# Patient Record
Sex: Male | Born: 1940
Health system: Southern US, Community
[De-identification: ages and names within clinical notes are randomized; demographics above are authoritative.]

## PROBLEM LIST (undated history)

## (undated) DIAGNOSIS — M109 Gout, unspecified: Secondary | ICD-10-CM

## (undated) DIAGNOSIS — I350 Nonrheumatic aortic (valve) stenosis: Secondary | ICD-10-CM

## (undated) DIAGNOSIS — R7303 Prediabetes: Secondary | ICD-10-CM

## (undated) DIAGNOSIS — M1710 Unilateral primary osteoarthritis, unspecified knee: Secondary | ICD-10-CM

## (undated) DIAGNOSIS — M1711 Unilateral primary osteoarthritis, right knee: Secondary | ICD-10-CM

## (undated) DIAGNOSIS — S82002A Unspecified fracture of left patella, initial encounter for closed fracture: Secondary | ICD-10-CM

## (undated) DIAGNOSIS — Z974 Presence of external hearing-aid: Secondary | ICD-10-CM

## (undated) DIAGNOSIS — Z87828 Personal history of other (healed) physical injury and trauma: Secondary | ICD-10-CM

## (undated) DIAGNOSIS — R9431 Abnormal electrocardiogram [ECG] [EKG]: Secondary | ICD-10-CM

## (undated) DIAGNOSIS — I10 Essential (primary) hypertension: Secondary | ICD-10-CM

## (undated) DIAGNOSIS — C8590 Non-Hodgkin lymphoma, unspecified, unspecified site: Secondary | ICD-10-CM

## (undated) DIAGNOSIS — Z973 Presence of spectacles and contact lenses: Secondary | ICD-10-CM

## (undated) DIAGNOSIS — I358 Other nonrheumatic aortic valve disorders: Secondary | ICD-10-CM

## (undated) DIAGNOSIS — M8580 Other specified disorders of bone density and structure, unspecified site: Secondary | ICD-10-CM

## (undated) HISTORY — PX: EYE SURGERY: SHX253

## (undated) HISTORY — PX: JOINT REPLACEMENT: SHX530

## (undated) HISTORY — DX: Unspecified fracture of left patella, initial encounter for closed fracture: S82.002A

## (undated) HISTORY — DX: Other specified disorders of bone density and structure, unspecified site: M85.80

## (undated) HISTORY — DX: Personal history of other (healed) physical injury and trauma: Z87.828

---
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
## 1979-07-30 HISTORY — PX: VASECTOMY: SHX75

## 2011-08-22 DIAGNOSIS — H903 Sensorineural hearing loss, bilateral: Secondary | ICD-10-CM | POA: Diagnosis not present

## 2011-08-22 DIAGNOSIS — B49 Unspecified mycosis: Secondary | ICD-10-CM | POA: Diagnosis not present

## 2011-08-26 DIAGNOSIS — Z23 Encounter for immunization: Secondary | ICD-10-CM | POA: Diagnosis not present

## 2011-08-26 DIAGNOSIS — R059 Cough, unspecified: Secondary | ICD-10-CM | POA: Diagnosis not present

## 2011-08-26 DIAGNOSIS — R05 Cough: Secondary | ICD-10-CM | POA: Diagnosis not present

## 2011-10-07 DIAGNOSIS — B49 Unspecified mycosis: Secondary | ICD-10-CM | POA: Diagnosis not present

## 2011-10-07 DIAGNOSIS — H903 Sensorineural hearing loss, bilateral: Secondary | ICD-10-CM | POA: Diagnosis not present

## 2011-10-09 DIAGNOSIS — E7800 Pure hypercholesterolemia, unspecified: Secondary | ICD-10-CM | POA: Diagnosis not present

## 2011-10-15 DIAGNOSIS — E7800 Pure hypercholesterolemia, unspecified: Secondary | ICD-10-CM | POA: Diagnosis not present

## 2011-12-27 DIAGNOSIS — H903 Sensorineural hearing loss, bilateral: Secondary | ICD-10-CM | POA: Diagnosis not present

## 2011-12-27 DIAGNOSIS — H6120 Impacted cerumen, unspecified ear: Secondary | ICD-10-CM | POA: Diagnosis not present

## 2012-01-13 DIAGNOSIS — M47817 Spondylosis without myelopathy or radiculopathy, lumbosacral region: Secondary | ICD-10-CM | POA: Diagnosis not present

## 2012-01-15 DIAGNOSIS — E559 Vitamin D deficiency, unspecified: Secondary | ICD-10-CM | POA: Diagnosis not present

## 2012-01-15 DIAGNOSIS — C8299 Follicular lymphoma, unspecified, extranodal and solid organ sites: Secondary | ICD-10-CM | POA: Diagnosis not present

## 2012-03-19 DIAGNOSIS — H2510 Age-related nuclear cataract, unspecified eye: Secondary | ICD-10-CM | POA: Diagnosis not present

## 2012-03-19 DIAGNOSIS — H43399 Other vitreous opacities, unspecified eye: Secondary | ICD-10-CM | POA: Diagnosis not present

## 2012-04-14 DIAGNOSIS — E7800 Pure hypercholesterolemia, unspecified: Secondary | ICD-10-CM | POA: Diagnosis not present

## 2012-04-14 DIAGNOSIS — I10 Essential (primary) hypertension: Secondary | ICD-10-CM | POA: Diagnosis not present

## 2012-04-15 DIAGNOSIS — I10 Essential (primary) hypertension: Secondary | ICD-10-CM | POA: Diagnosis not present

## 2012-04-15 DIAGNOSIS — E7800 Pure hypercholesterolemia, unspecified: Secondary | ICD-10-CM | POA: Diagnosis not present

## 2012-05-28 DIAGNOSIS — Z23 Encounter for immunization: Secondary | ICD-10-CM | POA: Diagnosis not present

## 2012-06-23 DIAGNOSIS — N419 Inflammatory disease of prostate, unspecified: Secondary | ICD-10-CM | POA: Diagnosis not present

## 2012-06-23 DIAGNOSIS — R300 Dysuria: Secondary | ICD-10-CM | POA: Diagnosis not present

## 2012-06-24 DIAGNOSIS — R300 Dysuria: Secondary | ICD-10-CM | POA: Diagnosis not present

## 2012-07-03 DIAGNOSIS — E785 Hyperlipidemia, unspecified: Secondary | ICD-10-CM | POA: Diagnosis not present

## 2012-07-03 DIAGNOSIS — Z79899 Other long term (current) drug therapy: Secondary | ICD-10-CM | POA: Diagnosis not present

## 2012-07-03 DIAGNOSIS — C8298 Follicular lymphoma, unspecified, lymph nodes of multiple sites: Secondary | ICD-10-CM | POA: Diagnosis not present

## 2012-07-03 DIAGNOSIS — I10 Essential (primary) hypertension: Secondary | ICD-10-CM | POA: Diagnosis not present

## 2012-07-03 DIAGNOSIS — R21 Rash and other nonspecific skin eruption: Secondary | ICD-10-CM | POA: Diagnosis not present

## 2012-07-03 DIAGNOSIS — H9190 Unspecified hearing loss, unspecified ear: Secondary | ICD-10-CM | POA: Diagnosis not present

## 2012-07-03 DIAGNOSIS — N400 Enlarged prostate without lower urinary tract symptoms: Secondary | ICD-10-CM | POA: Diagnosis not present

## 2012-07-03 DIAGNOSIS — E559 Vitamin D deficiency, unspecified: Secondary | ICD-10-CM | POA: Diagnosis not present

## 2012-07-03 DIAGNOSIS — C8299 Follicular lymphoma, unspecified, extranodal and solid organ sites: Secondary | ICD-10-CM | POA: Diagnosis not present

## 2012-07-03 DIAGNOSIS — C8599 Non-Hodgkin lymphoma, unspecified, extranodal and solid organ sites: Secondary | ICD-10-CM | POA: Diagnosis not present

## 2012-10-29 DIAGNOSIS — H2510 Age-related nuclear cataract, unspecified eye: Secondary | ICD-10-CM | POA: Diagnosis not present

## 2012-10-29 DIAGNOSIS — H538 Other visual disturbances: Secondary | ICD-10-CM | POA: Diagnosis not present

## 2013-01-01 DIAGNOSIS — E785 Hyperlipidemia, unspecified: Secondary | ICD-10-CM | POA: Diagnosis not present

## 2013-01-01 DIAGNOSIS — I10 Essential (primary) hypertension: Secondary | ICD-10-CM | POA: Diagnosis not present

## 2013-01-01 DIAGNOSIS — E559 Vitamin D deficiency, unspecified: Secondary | ICD-10-CM | POA: Diagnosis not present

## 2013-01-01 DIAGNOSIS — N400 Enlarged prostate without lower urinary tract symptoms: Secondary | ICD-10-CM | POA: Diagnosis not present

## 2013-01-01 DIAGNOSIS — N320 Bladder-neck obstruction: Secondary | ICD-10-CM | POA: Diagnosis not present

## 2013-01-01 DIAGNOSIS — C8299 Follicular lymphoma, unspecified, extranodal and solid organ sites: Secondary | ICD-10-CM | POA: Diagnosis not present

## 2013-05-27 DIAGNOSIS — L821 Other seborrheic keratosis: Secondary | ICD-10-CM | POA: Diagnosis not present

## 2013-05-27 DIAGNOSIS — L578 Other skin changes due to chronic exposure to nonionizing radiation: Secondary | ICD-10-CM | POA: Diagnosis not present

## 2013-05-27 DIAGNOSIS — L723 Sebaceous cyst: Secondary | ICD-10-CM | POA: Diagnosis not present

## 2013-07-07 DIAGNOSIS — L578 Other skin changes due to chronic exposure to nonionizing radiation: Secondary | ICD-10-CM | POA: Diagnosis not present

## 2013-07-07 DIAGNOSIS — L738 Other specified follicular disorders: Secondary | ICD-10-CM | POA: Diagnosis not present

## 2013-07-07 DIAGNOSIS — L299 Pruritus, unspecified: Secondary | ICD-10-CM | POA: Diagnosis not present

## 2013-07-12 DIAGNOSIS — N320 Bladder-neck obstruction: Secondary | ICD-10-CM | POA: Diagnosis not present

## 2013-07-12 DIAGNOSIS — I10 Essential (primary) hypertension: Secondary | ICD-10-CM | POA: Diagnosis not present

## 2013-07-12 DIAGNOSIS — N138 Other obstructive and reflux uropathy: Secondary | ICD-10-CM | POA: Diagnosis not present

## 2013-07-12 DIAGNOSIS — N401 Enlarged prostate with lower urinary tract symptoms: Secondary | ICD-10-CM | POA: Diagnosis not present

## 2013-07-12 DIAGNOSIS — E559 Vitamin D deficiency, unspecified: Secondary | ICD-10-CM | POA: Diagnosis not present

## 2013-07-12 DIAGNOSIS — E785 Hyperlipidemia, unspecified: Secondary | ICD-10-CM | POA: Diagnosis not present

## 2013-07-12 DIAGNOSIS — C8299 Follicular lymphoma, unspecified, extranodal and solid organ sites: Secondary | ICD-10-CM | POA: Diagnosis not present

## 2013-08-06 DIAGNOSIS — L299 Pruritus, unspecified: Secondary | ICD-10-CM | POA: Diagnosis not present

## 2013-08-06 DIAGNOSIS — L578 Other skin changes due to chronic exposure to nonionizing radiation: Secondary | ICD-10-CM | POA: Diagnosis not present

## 2013-08-06 DIAGNOSIS — L738 Other specified follicular disorders: Secondary | ICD-10-CM | POA: Diagnosis not present

## 2013-08-06 DIAGNOSIS — L678 Other hair color and hair shaft abnormalities: Secondary | ICD-10-CM | POA: Diagnosis not present

## 2014-01-05 DIAGNOSIS — M25569 Pain in unspecified knee: Secondary | ICD-10-CM | POA: Diagnosis not present

## 2014-01-05 DIAGNOSIS — M1710 Unilateral primary osteoarthritis, unspecified knee: Secondary | ICD-10-CM | POA: Diagnosis not present

## 2014-02-16 DIAGNOSIS — C8299 Follicular lymphoma, unspecified, extranodal and solid organ sites: Secondary | ICD-10-CM | POA: Diagnosis not present

## 2014-02-16 DIAGNOSIS — R634 Abnormal weight loss: Secondary | ICD-10-CM | POA: Diagnosis not present

## 2014-02-16 DIAGNOSIS — N320 Bladder-neck obstruction: Secondary | ICD-10-CM | POA: Diagnosis not present

## 2014-02-16 DIAGNOSIS — N400 Enlarged prostate without lower urinary tract symptoms: Secondary | ICD-10-CM | POA: Diagnosis not present

## 2014-02-16 DIAGNOSIS — Z09 Encounter for follow-up examination after completed treatment for conditions other than malignant neoplasm: Secondary | ICD-10-CM | POA: Diagnosis not present

## 2014-02-16 DIAGNOSIS — R5383 Other fatigue: Secondary | ICD-10-CM | POA: Diagnosis not present

## 2014-02-16 DIAGNOSIS — I10 Essential (primary) hypertension: Secondary | ICD-10-CM | POA: Diagnosis not present

## 2014-02-16 DIAGNOSIS — Z87898 Personal history of other specified conditions: Secondary | ICD-10-CM | POA: Diagnosis not present

## 2014-02-16 DIAGNOSIS — R61 Generalized hyperhidrosis: Secondary | ICD-10-CM | POA: Diagnosis not present

## 2014-02-16 DIAGNOSIS — R5381 Other malaise: Secondary | ICD-10-CM | POA: Diagnosis not present

## 2014-02-16 DIAGNOSIS — E785 Hyperlipidemia, unspecified: Secondary | ICD-10-CM | POA: Diagnosis not present

## 2014-02-25 DIAGNOSIS — I998 Other disorder of circulatory system: Secondary | ICD-10-CM | POA: Diagnosis not present

## 2014-02-25 DIAGNOSIS — R61 Generalized hyperhidrosis: Secondary | ICD-10-CM | POA: Diagnosis not present

## 2014-02-25 DIAGNOSIS — K449 Diaphragmatic hernia without obstruction or gangrene: Secondary | ICD-10-CM | POA: Diagnosis not present

## 2014-02-25 DIAGNOSIS — R5383 Other fatigue: Secondary | ICD-10-CM | POA: Diagnosis not present

## 2014-02-25 DIAGNOSIS — I700 Atherosclerosis of aorta: Secondary | ICD-10-CM | POA: Diagnosis not present

## 2014-02-25 DIAGNOSIS — R5381 Other malaise: Secondary | ICD-10-CM | POA: Diagnosis not present

## 2014-02-25 DIAGNOSIS — N400 Enlarged prostate without lower urinary tract symptoms: Secondary | ICD-10-CM | POA: Diagnosis not present

## 2014-02-25 DIAGNOSIS — C8299 Follicular lymphoma, unspecified, extranodal and solid organ sites: Secondary | ICD-10-CM | POA: Diagnosis not present

## 2014-02-25 DIAGNOSIS — I10 Essential (primary) hypertension: Secondary | ICD-10-CM | POA: Diagnosis not present

## 2014-02-25 DIAGNOSIS — D739 Disease of spleen, unspecified: Secondary | ICD-10-CM | POA: Diagnosis not present

## 2014-02-25 DIAGNOSIS — E559 Vitamin D deficiency, unspecified: Secondary | ICD-10-CM | POA: Diagnosis not present

## 2014-02-25 DIAGNOSIS — K5730 Diverticulosis of large intestine without perforation or abscess without bleeding: Secondary | ICD-10-CM | POA: Diagnosis not present

## 2014-02-25 DIAGNOSIS — C8589 Other specified types of non-Hodgkin lymphoma, extranodal and solid organ sites: Secondary | ICD-10-CM | POA: Diagnosis not present

## 2014-02-25 DIAGNOSIS — K8020 Calculus of gallbladder without cholecystitis without obstruction: Secondary | ICD-10-CM | POA: Diagnosis not present

## 2014-02-25 DIAGNOSIS — R634 Abnormal weight loss: Secondary | ICD-10-CM | POA: Diagnosis not present

## 2014-02-25 DIAGNOSIS — E785 Hyperlipidemia, unspecified: Secondary | ICD-10-CM | POA: Diagnosis not present

## 2014-02-25 DIAGNOSIS — M5380 Other specified dorsopathies, site unspecified: Secondary | ICD-10-CM | POA: Diagnosis not present

## 2014-02-25 DIAGNOSIS — R918 Other nonspecific abnormal finding of lung field: Secondary | ICD-10-CM | POA: Diagnosis not present

## 2014-02-25 DIAGNOSIS — R911 Solitary pulmonary nodule: Secondary | ICD-10-CM | POA: Diagnosis not present

## 2014-05-06 DIAGNOSIS — Z23 Encounter for immunization: Secondary | ICD-10-CM | POA: Diagnosis not present

## 2014-07-11 DIAGNOSIS — M1711 Unilateral primary osteoarthritis, right knee: Secondary | ICD-10-CM | POA: Diagnosis not present

## 2014-07-14 DIAGNOSIS — M25561 Pain in right knee: Secondary | ICD-10-CM | POA: Diagnosis not present

## 2014-07-19 DIAGNOSIS — M1711 Unilateral primary osteoarthritis, right knee: Secondary | ICD-10-CM | POA: Diagnosis not present

## 2014-08-01 DIAGNOSIS — B0229 Other postherpetic nervous system involvement: Secondary | ICD-10-CM | POA: Diagnosis not present

## 2014-08-01 DIAGNOSIS — R739 Hyperglycemia, unspecified: Secondary | ICD-10-CM | POA: Diagnosis not present

## 2014-08-01 DIAGNOSIS — Z0000 Encounter for general adult medical examination without abnormal findings: Secondary | ICD-10-CM | POA: Diagnosis not present

## 2014-08-01 DIAGNOSIS — C8590 Non-Hodgkin lymphoma, unspecified, unspecified site: Secondary | ICD-10-CM | POA: Diagnosis not present

## 2014-08-01 DIAGNOSIS — I10 Essential (primary) hypertension: Secondary | ICD-10-CM | POA: Diagnosis not present

## 2014-08-01 DIAGNOSIS — E559 Vitamin D deficiency, unspecified: Secondary | ICD-10-CM | POA: Diagnosis not present

## 2014-08-01 DIAGNOSIS — E785 Hyperlipidemia, unspecified: Secondary | ICD-10-CM | POA: Diagnosis not present

## 2014-08-05 DIAGNOSIS — M1A9XX0 Chronic gout, unspecified, without tophus (tophi): Secondary | ICD-10-CM | POA: Diagnosis not present

## 2014-08-05 DIAGNOSIS — Y929 Unspecified place or not applicable: Secondary | ICD-10-CM | POA: Diagnosis not present

## 2014-08-05 DIAGNOSIS — S83281A Other tear of lateral meniscus, current injury, right knee, initial encounter: Secondary | ICD-10-CM | POA: Diagnosis not present

## 2014-08-05 DIAGNOSIS — X58XXXA Exposure to other specified factors, initial encounter: Secondary | ICD-10-CM | POA: Diagnosis not present

## 2014-08-05 DIAGNOSIS — M94261 Chondromalacia, right knee: Secondary | ICD-10-CM | POA: Diagnosis not present

## 2014-08-05 DIAGNOSIS — S83241A Other tear of medial meniscus, current injury, right knee, initial encounter: Secondary | ICD-10-CM | POA: Diagnosis not present

## 2014-08-05 DIAGNOSIS — M659 Synovitis and tenosynovitis, unspecified: Secondary | ICD-10-CM | POA: Diagnosis not present

## 2014-08-05 HISTORY — PX: KNEE ARTHROSCOPY WITH MEDIAL MENISECTOMY: SHX5651

## 2014-09-02 DIAGNOSIS — C8200 Follicular lymphoma grade I, unspecified site: Secondary | ICD-10-CM | POA: Diagnosis not present

## 2014-09-02 DIAGNOSIS — R5383 Other fatigue: Secondary | ICD-10-CM | POA: Diagnosis not present

## 2014-09-02 DIAGNOSIS — E559 Vitamin D deficiency, unspecified: Secondary | ICD-10-CM | POA: Diagnosis not present

## 2014-09-02 DIAGNOSIS — R634 Abnormal weight loss: Secondary | ICD-10-CM | POA: Diagnosis not present

## 2014-09-02 DIAGNOSIS — R6889 Other general symptoms and signs: Secondary | ICD-10-CM | POA: Diagnosis not present

## 2014-09-02 DIAGNOSIS — Z6828 Body mass index (BMI) 28.0-28.9, adult: Secondary | ICD-10-CM | POA: Diagnosis not present

## 2014-09-02 DIAGNOSIS — R61 Generalized hyperhidrosis: Secondary | ICD-10-CM | POA: Diagnosis not present

## 2014-09-02 DIAGNOSIS — E785 Hyperlipidemia, unspecified: Secondary | ICD-10-CM | POA: Diagnosis not present

## 2014-09-02 DIAGNOSIS — N401 Enlarged prostate with lower urinary tract symptoms: Secondary | ICD-10-CM | POA: Diagnosis not present

## 2014-09-02 DIAGNOSIS — N320 Bladder-neck obstruction: Secondary | ICD-10-CM | POA: Diagnosis not present

## 2014-09-02 DIAGNOSIS — I10 Essential (primary) hypertension: Secondary | ICD-10-CM | POA: Diagnosis not present

## 2014-09-19 DIAGNOSIS — M109 Gout, unspecified: Secondary | ICD-10-CM | POA: Diagnosis not present

## 2014-10-20 DIAGNOSIS — E119 Type 2 diabetes mellitus without complications: Secondary | ICD-10-CM | POA: Diagnosis not present

## 2014-10-20 DIAGNOSIS — M109 Gout, unspecified: Secondary | ICD-10-CM | POA: Diagnosis not present

## 2014-12-19 DIAGNOSIS — M109 Gout, unspecified: Secondary | ICD-10-CM | POA: Diagnosis not present

## 2015-02-06 DIAGNOSIS — M25561 Pain in right knee: Secondary | ICD-10-CM | POA: Diagnosis not present

## 2015-02-06 DIAGNOSIS — T560X1A Toxic effect of lead and its compounds, accidental (unintentional), initial encounter: Secondary | ICD-10-CM | POA: Diagnosis not present

## 2015-02-10 DIAGNOSIS — M25561 Pain in right knee: Secondary | ICD-10-CM | POA: Diagnosis not present

## 2015-02-21 DIAGNOSIS — E119 Type 2 diabetes mellitus without complications: Secondary | ICD-10-CM | POA: Diagnosis not present

## 2015-02-21 DIAGNOSIS — N400 Enlarged prostate without lower urinary tract symptoms: Secondary | ICD-10-CM | POA: Diagnosis not present

## 2015-03-03 DIAGNOSIS — M109 Gout, unspecified: Secondary | ICD-10-CM | POA: Diagnosis not present

## 2015-03-03 DIAGNOSIS — C8200 Follicular lymphoma grade I, unspecified site: Secondary | ICD-10-CM | POA: Diagnosis not present

## 2015-03-03 DIAGNOSIS — Z6826 Body mass index (BMI) 26.0-26.9, adult: Secondary | ICD-10-CM | POA: Diagnosis not present

## 2015-03-03 DIAGNOSIS — N182 Chronic kidney disease, stage 2 (mild): Secondary | ICD-10-CM | POA: Diagnosis not present

## 2015-03-06 DIAGNOSIS — M25561 Pain in right knee: Secondary | ICD-10-CM | POA: Diagnosis not present

## 2015-03-06 DIAGNOSIS — T560X1A Toxic effect of lead and its compounds, accidental (unintentional), initial encounter: Secondary | ICD-10-CM | POA: Diagnosis not present

## 2015-03-14 DIAGNOSIS — M25561 Pain in right knee: Secondary | ICD-10-CM | POA: Diagnosis not present

## 2015-03-17 DIAGNOSIS — M25561 Pain in right knee: Secondary | ICD-10-CM | POA: Diagnosis not present

## 2015-03-21 DIAGNOSIS — M25561 Pain in right knee: Secondary | ICD-10-CM | POA: Diagnosis not present

## 2015-03-23 DIAGNOSIS — M25561 Pain in right knee: Secondary | ICD-10-CM | POA: Diagnosis not present

## 2015-03-27 DIAGNOSIS — T560X1D Toxic effect of lead and its compounds, accidental (unintentional), subsequent encounter: Secondary | ICD-10-CM | POA: Diagnosis not present

## 2015-03-27 DIAGNOSIS — M1711 Unilateral primary osteoarthritis, right knee: Secondary | ICD-10-CM | POA: Diagnosis not present

## 2015-03-28 DIAGNOSIS — M25561 Pain in right knee: Secondary | ICD-10-CM | POA: Diagnosis not present

## 2015-03-30 DIAGNOSIS — M25561 Pain in right knee: Secondary | ICD-10-CM | POA: Diagnosis not present

## 2015-04-04 DIAGNOSIS — M1711 Unilateral primary osteoarthritis, right knee: Secondary | ICD-10-CM | POA: Diagnosis not present

## 2015-04-10 DIAGNOSIS — M1711 Unilateral primary osteoarthritis, right knee: Secondary | ICD-10-CM | POA: Diagnosis not present

## 2015-04-10 DIAGNOSIS — T560X1A Toxic effect of lead and its compounds, accidental (unintentional), initial encounter: Secondary | ICD-10-CM | POA: Diagnosis not present

## 2015-05-08 DIAGNOSIS — T560X1D Toxic effect of lead and its compounds, accidental (unintentional), subsequent encounter: Secondary | ICD-10-CM | POA: Diagnosis not present

## 2015-05-08 DIAGNOSIS — M1711 Unilateral primary osteoarthritis, right knee: Secondary | ICD-10-CM | POA: Diagnosis not present

## 2015-05-19 DIAGNOSIS — L259 Unspecified contact dermatitis, unspecified cause: Secondary | ICD-10-CM | POA: Diagnosis not present

## 2015-05-19 DIAGNOSIS — L400 Psoriasis vulgaris: Secondary | ICD-10-CM | POA: Diagnosis not present

## 2015-05-25 DIAGNOSIS — M1A00X0 Idiopathic chronic gout, unspecified site, without tophus (tophi): Secondary | ICD-10-CM | POA: Diagnosis not present

## 2015-05-25 DIAGNOSIS — L309 Dermatitis, unspecified: Secondary | ICD-10-CM | POA: Diagnosis not present

## 2015-05-25 DIAGNOSIS — Z23 Encounter for immunization: Secondary | ICD-10-CM | POA: Diagnosis not present

## 2015-05-25 DIAGNOSIS — N401 Enlarged prostate with lower urinary tract symptoms: Secondary | ICD-10-CM | POA: Diagnosis not present

## 2015-05-25 DIAGNOSIS — I10 Essential (primary) hypertension: Secondary | ICD-10-CM | POA: Diagnosis not present

## 2015-06-19 DIAGNOSIS — B029 Zoster without complications: Secondary | ICD-10-CM | POA: Diagnosis not present

## 2015-08-14 DIAGNOSIS — L309 Dermatitis, unspecified: Secondary | ICD-10-CM | POA: Diagnosis not present

## 2015-08-21 DIAGNOSIS — Z0000 Encounter for general adult medical examination without abnormal findings: Secondary | ICD-10-CM | POA: Diagnosis not present

## 2015-08-21 DIAGNOSIS — Z1211 Encounter for screening for malignant neoplasm of colon: Secondary | ICD-10-CM | POA: Diagnosis not present

## 2015-08-21 DIAGNOSIS — I10 Essential (primary) hypertension: Secondary | ICD-10-CM | POA: Diagnosis not present

## 2015-08-21 DIAGNOSIS — E7801 Familial hypercholesterolemia: Secondary | ICD-10-CM | POA: Diagnosis not present

## 2015-08-21 DIAGNOSIS — M109 Gout, unspecified: Secondary | ICD-10-CM | POA: Diagnosis not present

## 2015-08-21 DIAGNOSIS — E559 Vitamin D deficiency, unspecified: Secondary | ICD-10-CM | POA: Diagnosis not present

## 2015-08-21 DIAGNOSIS — Z23 Encounter for immunization: Secondary | ICD-10-CM | POA: Diagnosis not present

## 2015-10-19 DIAGNOSIS — I358 Other nonrheumatic aortic valve disorders: Secondary | ICD-10-CM | POA: Diagnosis not present

## 2015-10-19 DIAGNOSIS — I10 Essential (primary) hypertension: Secondary | ICD-10-CM | POA: Diagnosis not present

## 2015-10-23 DIAGNOSIS — M1711 Unilateral primary osteoarthritis, right knee: Secondary | ICD-10-CM | POA: Diagnosis not present

## 2015-11-13 DIAGNOSIS — L298 Other pruritus: Secondary | ICD-10-CM | POA: Diagnosis not present

## 2015-11-13 DIAGNOSIS — L608 Other nail disorders: Secondary | ICD-10-CM | POA: Diagnosis not present

## 2015-11-21 DIAGNOSIS — R9431 Abnormal electrocardiogram [ECG] [EKG]: Secondary | ICD-10-CM | POA: Diagnosis not present

## 2015-11-21 DIAGNOSIS — E119 Type 2 diabetes mellitus without complications: Secondary | ICD-10-CM | POA: Diagnosis not present

## 2015-11-21 DIAGNOSIS — I358 Other nonrheumatic aortic valve disorders: Secondary | ICD-10-CM | POA: Diagnosis not present

## 2015-11-21 DIAGNOSIS — I10 Essential (primary) hypertension: Secondary | ICD-10-CM | POA: Diagnosis not present

## 2015-12-04 DIAGNOSIS — M25561 Pain in right knee: Secondary | ICD-10-CM | POA: Diagnosis not present

## 2015-12-04 DIAGNOSIS — M109 Gout, unspecified: Secondary | ICD-10-CM | POA: Diagnosis not present

## 2015-12-04 DIAGNOSIS — M1711 Unilateral primary osteoarthritis, right knee: Secondary | ICD-10-CM | POA: Diagnosis not present

## 2015-12-04 DIAGNOSIS — M25461 Effusion, right knee: Secondary | ICD-10-CM | POA: Diagnosis not present

## 2015-12-11 DIAGNOSIS — M1711 Unilateral primary osteoarthritis, right knee: Secondary | ICD-10-CM | POA: Diagnosis not present

## 2015-12-11 DIAGNOSIS — M25561 Pain in right knee: Secondary | ICD-10-CM | POA: Diagnosis not present

## 2015-12-18 DIAGNOSIS — M1711 Unilateral primary osteoarthritis, right knee: Secondary | ICD-10-CM | POA: Diagnosis not present

## 2015-12-27 DIAGNOSIS — M1711 Unilateral primary osteoarthritis, right knee: Secondary | ICD-10-CM | POA: Diagnosis not present

## 2016-01-03 DIAGNOSIS — H8113 Benign paroxysmal vertigo, bilateral: Secondary | ICD-10-CM | POA: Diagnosis not present

## 2016-01-03 DIAGNOSIS — E1165 Type 2 diabetes mellitus with hyperglycemia: Secondary | ICD-10-CM | POA: Diagnosis not present

## 2016-01-08 DIAGNOSIS — R9431 Abnormal electrocardiogram [ECG] [EKG]: Secondary | ICD-10-CM | POA: Diagnosis not present

## 2016-01-08 DIAGNOSIS — I358 Other nonrheumatic aortic valve disorders: Secondary | ICD-10-CM | POA: Diagnosis not present

## 2016-01-08 DIAGNOSIS — E119 Type 2 diabetes mellitus without complications: Secondary | ICD-10-CM | POA: Diagnosis not present

## 2016-01-08 DIAGNOSIS — I10 Essential (primary) hypertension: Secondary | ICD-10-CM | POA: Diagnosis not present

## 2016-03-04 DIAGNOSIS — C8290 Follicular lymphoma, unspecified, unspecified site: Secondary | ICD-10-CM | POA: Diagnosis not present

## 2016-03-04 DIAGNOSIS — Z79899 Other long term (current) drug therapy: Secondary | ICD-10-CM | POA: Diagnosis not present

## 2016-03-04 DIAGNOSIS — L219 Seborrheic dermatitis, unspecified: Secondary | ICD-10-CM | POA: Diagnosis not present

## 2016-03-04 DIAGNOSIS — C8590 Non-Hodgkin lymphoma, unspecified, unspecified site: Secondary | ICD-10-CM | POA: Diagnosis not present

## 2016-03-04 DIAGNOSIS — E785 Hyperlipidemia, unspecified: Secondary | ICD-10-CM | POA: Diagnosis not present

## 2016-03-04 DIAGNOSIS — Z6826 Body mass index (BMI) 26.0-26.9, adult: Secondary | ICD-10-CM | POA: Diagnosis not present

## 2016-03-04 DIAGNOSIS — N400 Enlarged prostate without lower urinary tract symptoms: Secondary | ICD-10-CM | POA: Diagnosis not present

## 2016-03-04 DIAGNOSIS — N329 Bladder disorder, unspecified: Secondary | ICD-10-CM | POA: Diagnosis not present

## 2016-03-04 DIAGNOSIS — M109 Gout, unspecified: Secondary | ICD-10-CM | POA: Diagnosis not present

## 2016-03-04 DIAGNOSIS — I10 Essential (primary) hypertension: Secondary | ICD-10-CM | POA: Diagnosis not present

## 2016-03-04 DIAGNOSIS — M1711 Unilateral primary osteoarthritis, right knee: Secondary | ICD-10-CM | POA: Diagnosis not present

## 2016-03-19 DIAGNOSIS — M1711 Unilateral primary osteoarthritis, right knee: Secondary | ICD-10-CM | POA: Diagnosis not present

## 2016-04-04 ENCOUNTER — Other Ambulatory Visit: Payer: Self-pay | Admitting: Orthopedic Surgery

## 2016-04-04 DIAGNOSIS — M7121 Synovial cyst of popliteal space [Baker], right knee: Secondary | ICD-10-CM

## 2016-04-24 ENCOUNTER — Ambulatory Visit
Admission: RE | Admit: 2016-04-24 | Discharge: 2016-04-24 | Disposition: A | Discharge status: Home / Self Care | Payer: Medicare Other | Source: Ambulatory Visit | Attending: Orthopedic Surgery | Admitting: Orthopedic Surgery

## 2016-04-24 DIAGNOSIS — M7121 Synovial cyst of popliteal space [Baker], right knee: Secondary | ICD-10-CM | POA: Diagnosis not present

## 2016-05-08 DIAGNOSIS — Z23 Encounter for immunization: Secondary | ICD-10-CM | POA: Diagnosis not present

## 2016-05-15 DIAGNOSIS — L298 Other pruritus: Secondary | ICD-10-CM | POA: Diagnosis not present

## 2016-06-04 DIAGNOSIS — I951 Orthostatic hypotension: Secondary | ICD-10-CM | POA: Diagnosis not present

## 2016-06-04 DIAGNOSIS — R9431 Abnormal electrocardiogram [ECG] [EKG]: Secondary | ICD-10-CM | POA: Diagnosis not present

## 2016-06-04 DIAGNOSIS — I10 Essential (primary) hypertension: Secondary | ICD-10-CM | POA: Diagnosis not present

## 2016-06-04 DIAGNOSIS — I358 Other nonrheumatic aortic valve disorders: Secondary | ICD-10-CM | POA: Diagnosis not present

## 2016-06-12 ENCOUNTER — Encounter (HOSPITAL_COMMUNITY): Payer: Self-pay | Admitting: Physician Assistant

## 2016-06-12 DIAGNOSIS — M1711 Unilateral primary osteoarthritis, right knee: Secondary | ICD-10-CM

## 2016-06-12 DIAGNOSIS — I350 Nonrheumatic aortic (valve) stenosis: Secondary | ICD-10-CM | POA: Diagnosis present

## 2016-06-12 DIAGNOSIS — R9431 Abnormal electrocardiogram [ECG] [EKG]: Secondary | ICD-10-CM | POA: Diagnosis present

## 2016-06-12 DIAGNOSIS — I10 Essential (primary) hypertension: Secondary | ICD-10-CM | POA: Diagnosis present

## 2016-06-12 DIAGNOSIS — C8590 Non-Hodgkin lymphoma, unspecified, unspecified site: Secondary | ICD-10-CM | POA: Diagnosis present

## 2016-06-12 HISTORY — DX: Unilateral primary osteoarthritis, right knee: M17.11

## 2016-06-12 NOTE — H&P (Signed)
TOTAL KNEE ADMISSION H&P  Patient is being admitted for right total knee arthroplasty.  Subjective:  Chief Complaint:right knee pain.  HPI: Lucas Pugh, 75 y.o. male, has a history of pain and functional disability in the right knee due to arthritis and has failed non-surgical conservative treatments for greater than 12 weeks to includeNSAID's and/or analgesics, corticosteriod injections, viscosupplementation injections, flexibility and strengthening excercises, supervised PT with diminished ADL's post treatment, use of assistive devices and activity modification.  Onset of symptoms was gradual, starting 10 years ago with gradually worsening course since that time. The patient noted prior procedures on the knee to include  arthroscopy and menisectomy on the right knee(s).  Patient currently rates pain in the right knee(s) at 10 out of 10 with activity. Patient has night pain, worsening of pain with activity and weight bearing, pain that interferes with activities of daily living, crepitus and joint swelling.  Patient has evidence of subchondral sclerosis, periarticular osteophytes and joint space narrowing by imaging studies.There is no active infection.  Patient Active Problem List   Diagnosis Date Noted  . Primary localized osteoarthritis of right knee 06/12/2016  . Heart murmur, aortic   . Abnormal EKG   . Hypertension   . Non Hodgkin's lymphoma ([place])    Past Medical History:  Diagnosis Date  . Abnormal EKG    follow by [facility] in [place] inferior MI  . Heart murmur, aortic   . Hypertension   . Non Hodgkin's lymphoma ([place])    currently in remission since 2003  . Primary localized osteoarthritis of right knee 06/12/2016    Past Surgical History:  Procedure Laterality Date  . KNEE ARTHROSCOPY WITH MEDIAL MENISECTOMY Right 08/05/2014   Dr Noemi Chapel  . TONSILECTOMY, ADENOIDECTOMY, BILATERAL MYRINGOTOMY AND TUBES  1945  . VASECTOMY  1981    No current  facility-administered medications for this encounter.   Current Outpatient Prescriptions:  .  allopurinol (ZYLOPRIM) 300 MG tablet, Take 300 mg by mouth daily., Disp: , Rfl:  .  amLODipine (NORVASC) 10 MG tablet, Take 10 mg by mouth daily., Disp: , Rfl:  .  cholecalciferol (VITAMIN D) 1000 units tablet, Take 1,000 Units by mouth daily., Disp: , Rfl:  .  gemfibrozil (LOPID) 600 MG tablet, Take 600 mg by mouth 2 (two) times daily. In the morning and at noon, Disp: , Rfl:  .  lisinopril (PRINIVIL,ZESTRIL) 10 MG tablet, Take 10 mg by mouth every evening., Disp: , Rfl:  .  Multiple Vitamin (MULTIVITAMIN WITH MINERALS) TABS tablet, Take 1 tablet by mouth daily., Disp: , Rfl:  .  pravastatin (PRAVACHOL) 20 MG tablet, Take 20 mg by mouth every evening., Disp: , Rfl:  .  tamsulosin (FLOMAX) 0.4 MG CAPS capsule, Take 0.4 mg by mouth every evening., Disp: , Rfl:  .  aspirin EC 81 MG tablet, Take 81 mg by mouth at bedtime., Disp: , Rfl:     No Known Allergies    Social History  Substance Use Topics  . Smoking status: Never Smoker  . Smokeless tobacco: Never Used  . Alcohol use Yes     Comment: nightly    Family History  Problem Relation Age of Onset  . Hypertension Mother   . Cancer Mother   . Heart attack Father 61  . Heart disease Father   . Hypertension Father   . Hypertension Sister   . Diabetes Mellitus II Sister      Review of Systems  Constitutional:  Negative.   HENT: Negative.   Eyes: Negative.   Respiratory: Negative.   Cardiovascular: Negative.   Gastrointestinal: Negative.   Genitourinary: Negative.   Musculoskeletal: Positive for back pain and joint pain.  Skin: Negative.   Neurological: Negative.   Endo/Heme/Allergies: Negative.   Psychiatric/Behavioral: Negative.     Objective:  Physical Exam  Constitutional: He is oriented to person, place, and time. He appears well-developed and well-nourished.  HENT:  Head: Normocephalic and atraumatic.  Eyes: Conjunctivae  are normal. Pupils are equal, round, and reactive to light.  Neck: Normal range of motion. Neck supple.  Cardiovascular: Normal rate and regular rhythm.   Murmur heard. Respiratory: Effort normal and breath sounds normal.  GI: Soft. Bowel sounds are normal.  Genitourinary:  Genitourinary Comments: Not pertinent to current symptomatology therefore not examined.  Musculoskeletal:   Examination of his right knee reveals pain medially and laterally.  1+ crepitation.  1+ synovitis.  Moderate varus deformity.  Range of motion from -5 to 110 degrees.  Knee is stable with normal patella tracking and diffuse pain.  Examination of his left knee reveals full range of motion without pain, swelling, weakness or instability.  Vascular exam: Pulses are 2+ and symmetric.    Neurological: He is alert and oriented to person, place, and time.  Skin: Skin is warm and dry.  Psychiatric: He has a normal mood and affect. His behavior is normal.    Vital signs in last 24 hours: Temp:  [97.3 F (36.3 C)] 97.3 F (36.3 C) (11/15 1400) Pulse Rate:  [75] 75 (11/15 1400) BP: (116)/(72) 116/72 (11/15 1400) SpO2:  [97 %] 97 % (11/15 1400) Weight:  [68 kg (150 lb)] 68 kg (150 lb) (11/15 1400)  Labs:   Estimated body mass index is 24.21 kg/m as calculated from the following:   Height as of this encounter: 5\' 6"  (1.676 m).   Weight as of this encounter: 68 kg (150 lb).   Imaging Review Plain radiographs demonstrate severe degenerative joint disease of the right knee(s). The overall alignment issignificant varus. The bone quality appears to be good for age and reported activity level.  Assessment/Plan:  End stage arthritis, right knee  Principal Problem:   Primary localized osteoarthritis of right knee Active Problems:   Heart murmur, aortic   Abnormal EKG   Hypertension   Non Hodgkin's lymphoma ([place])   The patient history, physical examination, clinical judgment of the provider and imaging studies are  consistent with end stage degenerative joint disease of the right knee(s) and total knee arthroplasty is deemed medically necessary. The treatment options including medical management, injection therapy arthroscopy and arthroplasty were discussed at length. The risks and benefits of total knee arthroplasty were presented and reviewed. The risks due to aseptic loosening, infection, stiffness, patella tracking problems, thromboembolic complications and other imponderables were discussed. The patient acknowledged the explanation, agreed to proceed with the plan and consent was signed. Patient is being admitted for inpatient treatment for surgery, pain control, PT, OT, prophylactic antibiotics, VTE prophylaxis, progressive ambulation and ADL's and discharge planning. The patient is planning to be discharged home with home health services

## 2016-06-14 ENCOUNTER — Encounter (HOSPITAL_COMMUNITY): Payer: Self-pay

## 2016-06-14 ENCOUNTER — Encounter (HOSPITAL_COMMUNITY)
Admission: RE | Admit: 2016-06-14 | Discharge: 2016-06-14 | Disposition: A | Discharge status: Home / Self Care | Payer: Medicare Other | Source: Ambulatory Visit | Attending: Orthopedic Surgery | Admitting: Orthopedic Surgery

## 2016-06-14 DIAGNOSIS — I10 Essential (primary) hypertension: Secondary | ICD-10-CM | POA: Diagnosis not present

## 2016-06-14 DIAGNOSIS — Z0183 Encounter for blood typing: Secondary | ICD-10-CM | POA: Diagnosis not present

## 2016-06-14 DIAGNOSIS — M1711 Unilateral primary osteoarthritis, right knee: Secondary | ICD-10-CM | POA: Insufficient documentation

## 2016-06-14 DIAGNOSIS — Z7982 Long term (current) use of aspirin: Secondary | ICD-10-CM | POA: Diagnosis not present

## 2016-06-14 DIAGNOSIS — Z01812 Encounter for preprocedural laboratory examination: Secondary | ICD-10-CM | POA: Insufficient documentation

## 2016-06-14 DIAGNOSIS — Z01818 Encounter for other preprocedural examination: Secondary | ICD-10-CM | POA: Diagnosis not present

## 2016-06-14 DIAGNOSIS — C8590 Non-Hodgkin lymphoma, unspecified, unspecified site: Secondary | ICD-10-CM | POA: Insufficient documentation

## 2016-06-14 DIAGNOSIS — Z79899 Other long term (current) drug therapy: Secondary | ICD-10-CM | POA: Diagnosis not present

## 2016-06-14 DIAGNOSIS — I350 Nonrheumatic aortic (valve) stenosis: Secondary | ICD-10-CM | POA: Insufficient documentation

## 2016-06-14 HISTORY — DX: Unilateral primary osteoarthritis, unspecified knee: M17.10

## 2016-06-14 HISTORY — DX: Presence of external hearing-aid: Z97.4

## 2016-06-14 HISTORY — DX: Presence of spectacles and contact lenses: Z97.3

## 2016-06-14 HISTORY — DX: Nonrheumatic aortic (valve) stenosis: I35.0

## 2016-06-14 HISTORY — DX: Gout, unspecified: M10.9

## 2016-06-14 LAB — ABO/RH: ABO/RH(D): A NEG

## 2016-06-14 LAB — COMPREHENSIVE METABOLIC PANEL
ALBUMIN: 4.4 g/dL (ref 3.5–5.0)
ALT: 17 U/L (ref 17–63)
ANION GAP: 6 (ref 5–15)
AST: 19 U/L (ref 15–41)
Alkaline Phosphatase: 52 U/L (ref 38–126)
BILIRUBIN TOTAL: 0.6 mg/dL (ref 0.3–1.2)
BUN: 22 mg/dL — ABNORMAL HIGH (ref 6–20)
CO2: 23 mmol/L (ref 22–32)
Calcium: 10.1 mg/dL (ref 8.9–10.3)
Chloride: 110 mmol/L (ref 101–111)
Creatinine, Ser: 1.01 mg/dL (ref 0.61–1.24)
GFR calc Af Amer: >60 mL/min (ref >60)
GFR calc non Af Amer: >60 mL/min (ref >60)
GLUCOSE: 105 mg/dL — AB (ref 65–99)
POTASSIUM: 3.6 mmol/L (ref 3.5–5.1)
SODIUM: 139 mmol/L (ref 135–145)
TOTAL PROTEIN: 7.1 g/dL (ref 6.5–8.1)

## 2016-06-14 LAB — CBC WITH DIFFERENTIAL/PLATELET
BASOS PCT: 2 %
Basophils Absolute: 0.1 10*3/uL (ref 0.0–0.1)
EOS ABS: 0.3 10*3/uL (ref 0.0–0.7)
EOS PCT: 5 %
HEMATOCRIT: 44.4 % (ref 39.0–52.0)
Hemoglobin: 15.4 g/dL (ref 13.0–17.0)
Lymphocytes Relative: 26 %
Lymphs Abs: 1.6 10*3/uL (ref 0.7–4.0)
MCH: 32.8 pg (ref 26.0–34.0)
MCHC: 34.7 g/dL (ref 30.0–36.0)
MCV: 94.7 fL (ref 78.0–100.0)
MONO ABS: 0.5 10*3/uL (ref 0.1–1.0)
MONOS PCT: 8 %
Neutro Abs: 3.6 10*3/uL (ref 1.7–7.7)
Neutrophils Relative %: 59 %
PLATELETS: 251 10*3/uL (ref 150–400)
RBC: 4.69 MIL/uL (ref 4.22–5.81)
RDW: 13.4 % (ref 11.5–15.5)
WBC: 6.2 10*3/uL (ref 4.0–10.5)

## 2016-06-14 LAB — APTT: aPTT: 30 seconds (ref 24–36)

## 2016-06-14 LAB — SURGICAL PCR SCREEN
MRSA, PCR: NEGATIVE
STAPHYLOCOCCUS AUREUS: NEGATIVE

## 2016-06-14 LAB — TYPE AND SCREEN
ABO/RH(D): A NEG
ANTIBODY SCREEN: NEGATIVE

## 2016-06-14 LAB — PROTIME-INR
INR: 0.99
Prothrombin Time: 13.1 seconds (ref 11.4–15.2)

## 2016-06-14 NOTE — Pre-Procedure Instructions (Signed)
Lucas Pugh  06/14/2016      [facility], [place] [address] Phone: [phone] Fax: [phone]    Your procedure is scheduled on Monday, June 24, 2016  Report to [facility] Admitting at 7:45 A.M.  Call this number if you have problems the morning of surgery:  [phone]   Remember:  Do not eat food or drink liquids after midnight Sunday, June 23, 2016  Take these medicines the morning of surgery with A SIP OF WATER :allopurinol (ZYLOPRIM), amLODipine (NORVASC), gemfibrozil (LOPID) Stop taking Aspirin ( already stopped per pt), vitamins, fish oil and herbal medications. Do not take any NSAIDs ie: Ibuprofen, Advil, Naproxen, BC and Goody Powder or any medication containing Aspirin; stop Monday, June 17, 2016  Do not wear jewelry, make-up or nail polish.  Do not wear lotions, powders, or perfumes, or deoderant.  Do not shave 48 hours prior to surgery.  Men may shave face and neck.  Do not bring valuables to the hospital.  [facility] is not responsible for any belongings or valuables.  Contacts, dentures or bridgework may not be worn into surgery.  Leave your suitcase in the car.  After surgery it may be brought to your room.  For patients admitted to the hospital, discharge time will be determined by your treatment team.  Special instructions:  Special Instructions:Special Instructions: [facility] - Preparing for Surgery  Before surgery, you can play an important role.  Because skin is not sterile, your skin needs to be as free of germs as possible.  You can reduce the number of germs on you skin by washing with CHG (chlorahexidine gluconate) soap before surgery.  CHG is an antiseptic cleaner which kills germs and bonds with the skin to continue killing germs even after washing.  Please DO NOT use if you have an allergy to CHG or antibacterial soaps.  If your skin becomes  reddened/irritated stop using the CHG and inform your nurse when you arrive at Short Stay.  Do not shave (including legs and underarms) for at least 48 hours prior to the first CHG shower.  You may shave your face.  Please follow these instructions carefully:   1.  Shower with CHG Soap the night before surgery and the morning of Surgery.  2.  If you choose to wash your hair, wash your hair first as usual with your normal shampoo.  3.  After you shampoo, rinse your hair and body thoroughly to remove the Shampoo.  4.  Use CHG as you would any other liquid soap.  You can apply chg directly  to the skin and wash gently with scrungie or a clean washcloth.  5.  Apply the CHG Soap to your body ONLY FROM THE NECK DOWN.  Do not use on open wounds or open sores.  Avoid contact with your eyes, ears, mouth and genitals (private parts).  Wash genitals (private parts) with your normal soap.  6.  Wash thoroughly, paying special attention to the area where your surgery will be performed.  7.  Thoroughly rinse your body with warm water from the neck down.  8.  DO NOT shower/wash with your normal soap after using and rinsing off the CHG Soap.  9.  Pat yourself dry with a clean towel.            10 .  Wear clean pajamas.  11.  Place clean sheets on your bed the night of your first shower and do not sleep with pets.  Day of Surgery  Do not apply any lotions/deoderants the morning of surgery.  Please wear clean clothes to the hospital/surgery center.  Please read over the following fact sheets that you were given. Pain Booklet, Coughing and Deep Breathing, Blood Transfusion Information, MRSA Information and Surgical Site Infection Prevention

## 2016-06-14 NOTE — Progress Notes (Signed)
Pt denies having a chest x ray within the last year.

## 2016-06-14 NOTE — Progress Notes (Signed)
Pt denies SOB and chest pain but is under the care of Dr. Tommie Sams, Cardiology, at [facility].  Requested EKG, echo, LOV note and cardiac clearance note from cardiologist. Pt denies having a cardiac cath and stated that a stress test was performed > 10 years ago. Pt chart forwarded to anesthesia to review cardiac history. Pt stated that cardiologist is aware that he stopped taking Aspirin on 05/29/16 due to bleeding.

## 2016-06-15 LAB — URINE CULTURE: Culture: NO GROWTH

## 2016-06-17 ENCOUNTER — Encounter (HOSPITAL_COMMUNITY): Payer: Self-pay

## 2016-06-17 NOTE — Progress Notes (Signed)
Anesthesia Chart Review:  Pt is a 75 year old male scheduled for R total knee arthroplasty on 06/24/2016 with Elsie Saas, MD.   - Cardiologist is Lydia Guiles, MD in [place], [place], who cleared pt for surgery at last office visit 06/04/16.   PMH includes:  HTN, mild aortic stenosis, non-hodgin's lymphoma.  Never smoker. BMI 24  Medications include: amlodipine, ASA, gemfibrozil, lisinopril, pravastatin. Pt to stop ASA 5 days before surgery.   Preoperative labs reviewed.    EKG 06/04/16: sinus rhythm with 1st degree AV block. Low QRS voltage in precordial leads. Poor R wave progression.   Echo 11/21/15 ([facility]): 1. Normal LV function. 2. Aortic sclerosis with mild aortic stenosis, valve area 1.2 cm  If no changes, I anticipate pt can proceed with surgery as scheduled.   Willeen Cass, FNP-BC [facility] Short Stay Surgical Center/Anesthesiology Phone: [phone] 06/17/2016 2:40 PM

## 2016-06-24 ENCOUNTER — Inpatient Hospital Stay (HOSPITAL_COMMUNITY): Payer: Medicare Other | Admitting: Certified Registered Nurse Anesthetist

## 2016-06-24 ENCOUNTER — Encounter (HOSPITAL_COMMUNITY)
Admission: RE | Disposition: A | Discharge status: Home / Self Care | Payer: Self-pay | Source: Ambulatory Visit | Attending: Orthopedic Surgery

## 2016-06-24 ENCOUNTER — Encounter (HOSPITAL_COMMUNITY): Payer: Self-pay | Admitting: Certified Registered Nurse Anesthetist

## 2016-06-24 ENCOUNTER — Inpatient Hospital Stay (HOSPITAL_COMMUNITY): Payer: Medicare Other | Admitting: Emergency Medicine

## 2016-06-24 ENCOUNTER — Inpatient Hospital Stay (HOSPITAL_COMMUNITY)
Admission: RE | Admit: 2016-06-24 | Discharge: 2016-06-25 | DRG: 470 | Disposition: A | Discharge status: Home / Self Care | Payer: Medicare Other | Source: Ambulatory Visit | Attending: Orthopedic Surgery | Admitting: Orthopedic Surgery

## 2016-06-24 DIAGNOSIS — I350 Nonrheumatic aortic (valve) stenosis: Secondary | ICD-10-CM | POA: Diagnosis not present

## 2016-06-24 DIAGNOSIS — I10 Essential (primary) hypertension: Secondary | ICD-10-CM | POA: Diagnosis not present

## 2016-06-24 DIAGNOSIS — Z79899 Other long term (current) drug therapy: Secondary | ICD-10-CM | POA: Diagnosis not present

## 2016-06-24 DIAGNOSIS — M67861 Other specified disorders of synovium, right knee: Secondary | ICD-10-CM | POA: Diagnosis not present

## 2016-06-24 DIAGNOSIS — R9431 Abnormal electrocardiogram [ECG] [EKG]: Secondary | ICD-10-CM | POA: Diagnosis not present

## 2016-06-24 DIAGNOSIS — Z974 Presence of external hearing-aid: Secondary | ICD-10-CM | POA: Diagnosis not present

## 2016-06-24 DIAGNOSIS — M109 Gout, unspecified: Secondary | ICD-10-CM | POA: Diagnosis present

## 2016-06-24 DIAGNOSIS — C8590 Non-Hodgkin lymphoma, unspecified, unspecified site: Secondary | ICD-10-CM | POA: Diagnosis not present

## 2016-06-24 DIAGNOSIS — R7303 Prediabetes: Secondary | ICD-10-CM | POA: Diagnosis present

## 2016-06-24 DIAGNOSIS — M25561 Pain in right knee: Secondary | ICD-10-CM | POA: Diagnosis present

## 2016-06-24 DIAGNOSIS — G8918 Other acute postprocedural pain: Secondary | ICD-10-CM | POA: Diagnosis not present

## 2016-06-24 DIAGNOSIS — M1711 Unilateral primary osteoarthritis, right knee: Secondary | ICD-10-CM | POA: Diagnosis not present

## 2016-06-24 HISTORY — PX: TOTAL KNEE ARTHROPLASTY: SHX125

## 2016-06-24 HISTORY — DX: Prediabetes: R73.03

## 2016-06-24 HISTORY — DX: Abnormal electrocardiogram (ECG) (EKG): R94.31

## 2016-06-24 HISTORY — DX: Unilateral primary osteoarthritis, right knee: M17.11

## 2016-06-24 HISTORY — DX: Non-Hodgkin lymphoma, unspecified, unspecified site: C85.90

## 2016-06-24 HISTORY — DX: Essential (primary) hypertension: I10

## 2016-06-24 HISTORY — DX: Other nonrheumatic aortic valve disorders: I35.8

## 2016-06-24 LAB — GLUCOSE, CAPILLARY: GLUCOSE-CAPILLARY: 107 mg/dL — AB (ref 65–99)

## 2016-06-24 SURGERY — ARTHROPLASTY, KNEE, TOTAL
Anesthesia: Monitor Anesthesia Care | Site: Knee | Laterality: Right

## 2016-06-24 MED ORDER — ONDANSETRON HCL 4 MG/2ML IJ SOLN: 4.0000 mg | Freq: Four times a day (QID) | INTRAMUSCULAR | Status: DC | PRN

## 2016-06-24 MED ORDER — DIPHENHYDRAMINE HCL 12.5 MG/5ML PO ELIX: 12.5000 mg | ORAL_SOLUTION | ORAL | Status: DC | PRN

## 2016-06-24 MED ORDER — CEFAZOLIN SODIUM-DEXTROSE 2-4 GM/100ML-% IV SOLN
INTRAVENOUS | Status: AC
  Filled 2016-06-24: qty 100

## 2016-06-24 MED ORDER — FENTANYL CITRATE (PF) 100 MCG/2ML IJ SOLN
50.0000 ug | Freq: Once | INTRAMUSCULAR | Status: AC
  Administered 2016-06-24: 50 ug via INTRAVENOUS

## 2016-06-24 MED ORDER — EPHEDRINE 5 MG/ML INJ
INTRAVENOUS | Status: AC
  Filled 2016-06-24: qty 20

## 2016-06-24 MED ORDER — PHENYLEPHRINE HCL 10 MG/ML IJ SOLN
INTRAMUSCULAR | Status: DC | PRN
  Administered 2016-06-24 (×2): 80 ug via INTRAVENOUS

## 2016-06-24 MED ORDER — DEXAMETHASONE SODIUM PHOSPHATE 10 MG/ML IJ SOLN
INTRAMUSCULAR | Status: DC | PRN
  Administered 2016-06-24: 10 mg via INTRAVENOUS

## 2016-06-24 MED ORDER — PROPOFOL 500 MG/50ML IV EMUL
INTRAVENOUS | Status: DC | PRN
  Administered 2016-06-24: 75 ug/kg/min via INTRAVENOUS

## 2016-06-24 MED ORDER — ADULT MULTIVITAMIN W/MINERALS CH
1.0000 | ORAL_TABLET | Freq: Every day | ORAL | Status: DC
  Administered 2016-06-24 – 2016-06-25 (×2): 1 via ORAL
  Filled 2016-06-24 (×2): qty 1

## 2016-06-24 MED ORDER — PHENOL 1.4 % MT LIQD: 1.0000 | OROMUCOSAL | Status: DC | PRN

## 2016-06-24 MED ORDER — MIDAZOLAM HCL 2 MG/2ML IJ SOLN
INTRAMUSCULAR | Status: AC
  Filled 2016-06-24: qty 2

## 2016-06-24 MED ORDER — SODIUM CHLORIDE 0.9 % IR SOLN
Status: DC | PRN
  Administered 2016-06-24: 3000 mL

## 2016-06-24 MED ORDER — METOCLOPRAMIDE HCL 5 MG PO TABS: 5.0000 mg | ORAL_TABLET | Freq: Three times a day (TID) | ORAL | Status: DC | PRN

## 2016-06-24 MED ORDER — FENTANYL CITRATE (PF) 100 MCG/2ML IJ SOLN
INTRAMUSCULAR | Status: AC
  Administered 2016-06-24: 50 ug via INTRAVENOUS
  Filled 2016-06-24: qty 2

## 2016-06-24 MED ORDER — DEXAMETHASONE SODIUM PHOSPHATE 10 MG/ML IJ SOLN
10.0000 mg | Freq: Three times a day (TID) | INTRAMUSCULAR | Status: DC
  Administered 2016-06-24 – 2016-06-25 (×3): 10 mg via INTRAVENOUS
  Filled 2016-06-24 (×3): qty 1

## 2016-06-24 MED ORDER — METOCLOPRAMIDE HCL 5 MG/ML IJ SOLN: 5.0000 mg | Freq: Three times a day (TID) | INTRAMUSCULAR | Status: DC | PRN

## 2016-06-24 MED ORDER — EPHEDRINE SULFATE 50 MG/ML IJ SOLN
INTRAMUSCULAR | Status: DC | PRN
  Administered 2016-06-24: 10 mg via INTRAVENOUS

## 2016-06-24 MED ORDER — POVIDONE-IODINE 7.5 % EX SOLN: Freq: Once | CUTANEOUS | Status: DC

## 2016-06-24 MED ORDER — ASPIRIN EC 325 MG PO TBEC
325.0000 mg | DELAYED_RELEASE_TABLET | Freq: Every day | ORAL | Status: DC
  Administered 2016-06-25: 325 mg via ORAL
  Filled 2016-06-24: qty 1

## 2016-06-24 MED ORDER — 0.9 % SODIUM CHLORIDE (POUR BTL) OPTIME
TOPICAL | Status: DC | PRN
  Administered 2016-06-24: 1000 mL

## 2016-06-24 MED ORDER — LACTATED RINGERS IV SOLN
INTRAVENOUS | Status: DC
  Administered 2016-06-24 (×3): via INTRAVENOUS

## 2016-06-24 MED ORDER — CEFAZOLIN SODIUM-DEXTROSE 2-4 GM/100ML-% IV SOLN
2.0000 g | INTRAVENOUS | Status: AC
  Administered 2016-06-24: 2 g via INTRAVENOUS

## 2016-06-24 MED ORDER — BUPIVACAINE-EPINEPHRINE 0.25% -1:200000 IJ SOLN
INTRAMUSCULAR | Status: DC | PRN
  Administered 2016-06-24: 30 mL

## 2016-06-24 MED ORDER — BUPIVACAINE IN DEXTROSE 0.75-8.25 % IT SOLN
INTRATHECAL | Status: DC | PRN
  Administered 2016-06-24: 1.8 mL via INTRATHECAL

## 2016-06-24 MED ORDER — OXYCODONE HCL 5 MG/5ML PO SOLN
5.0000 mg | Freq: Once | ORAL | Status: DC | PRN
Start: 2016-06-24 — End: 2016-06-24

## 2016-06-24 MED ORDER — ACETAMINOPHEN 325 MG PO TABS
650.0000 mg | ORAL_TABLET | Freq: Four times a day (QID) | ORAL | Status: DC | PRN
Start: 2016-06-24 — End: 2016-06-25

## 2016-06-24 MED ORDER — MENTHOL 3 MG MT LOZG: 1.0000 | LOZENGE | OROMUCOSAL | Status: DC | PRN

## 2016-06-24 MED ORDER — FENTANYL CITRATE (PF) 100 MCG/2ML IJ SOLN
INTRAMUSCULAR | Status: AC
  Filled 2016-06-24: qty 2

## 2016-06-24 MED ORDER — CEFAZOLIN SODIUM-DEXTROSE 2-4 GM/100ML-% IV SOLN
2.0000 g | Freq: Four times a day (QID) | INTRAVENOUS | Status: AC
  Administered 2016-06-24 (×2): 2 g via INTRAVENOUS
  Filled 2016-06-24 (×2): qty 100

## 2016-06-24 MED ORDER — PRAVASTATIN SODIUM 20 MG PO TABS
20.0000 mg | ORAL_TABLET | Freq: Every evening | ORAL | Status: DC
  Administered 2016-06-24: 20 mg via ORAL
  Filled 2016-06-24: qty 1

## 2016-06-24 MED ORDER — ALUM & MAG HYDROXIDE-SIMETH 200-200-20 MG/5ML PO SUSP: 30.0000 mL | ORAL | Status: DC | PRN

## 2016-06-24 MED ORDER — ACETAMINOPHEN 650 MG RE SUPP: 650.0000 mg | Freq: Four times a day (QID) | RECTAL | Status: DC | PRN

## 2016-06-24 MED ORDER — OXYCODONE HCL 5 MG PO TABS: 5.0000 mg | ORAL_TABLET | Freq: Once | ORAL | Status: DC | PRN

## 2016-06-24 MED ORDER — POLYETHYLENE GLYCOL 3350 17 G PO PACK
17.0000 g | PACK | Freq: Two times a day (BID) | ORAL | Status: DC
  Administered 2016-06-24 – 2016-06-25 (×2): 17 g via ORAL
  Filled 2016-06-24 (×3): qty 1

## 2016-06-24 MED ORDER — EPINEPHRINE PF 1 MG/ML IJ SOLN
INTRAMUSCULAR | Status: AC
  Filled 2016-06-24: qty 1

## 2016-06-24 MED ORDER — OXYCODONE HCL 5 MG PO TABS
5.0000 mg | ORAL_TABLET | ORAL | Status: DC | PRN
  Administered 2016-06-24: 5 mg via ORAL
  Administered 2016-06-24 – 2016-06-25 (×3): 10 mg via ORAL
  Administered 2016-06-25: 5 mg via ORAL
  Filled 2016-06-24 (×2): qty 2
  Filled 2016-06-24: qty 1
  Filled 2016-06-24 (×2): qty 2

## 2016-06-24 MED ORDER — ROPIVACAINE HCL 5 MG/ML IJ SOLN
INTRAMUSCULAR | Status: DC | PRN
  Administered 2016-06-24: 20 mL via PERINEURAL

## 2016-06-24 MED ORDER — HYDROMORPHONE HCL 2 MG/ML IJ SOLN
1.0000 mg | INTRAMUSCULAR | Status: DC | PRN
  Administered 2016-06-24: 1 mg via INTRAVENOUS
  Filled 2016-06-24: qty 1

## 2016-06-24 MED ORDER — ONDANSETRON HCL 4 MG PO TABS: 4.0000 mg | ORAL_TABLET | Freq: Four times a day (QID) | ORAL | Status: DC | PRN

## 2016-06-24 MED ORDER — ONDANSETRON HCL 4 MG/2ML IJ SOLN
4.0000 mg | Freq: Four times a day (QID) | INTRAMUSCULAR | Status: DC | PRN
  Administered 2016-06-24: 4 mg via INTRAVENOUS
  Filled 2016-06-24: qty 2

## 2016-06-24 MED ORDER — ALLOPURINOL 300 MG PO TABS
300.0000 mg | ORAL_TABLET | Freq: Every day | ORAL | Status: DC
  Administered 2016-06-24 – 2016-06-25 (×2): 300 mg via ORAL
  Filled 2016-06-24 (×2): qty 1

## 2016-06-24 MED ORDER — ACETAMINOPHEN 500 MG PO TABS
1000.0000 mg | ORAL_TABLET | Freq: Four times a day (QID) | ORAL | Status: AC
  Administered 2016-06-24 – 2016-06-25 (×4): 1000 mg via ORAL
  Filled 2016-06-24 (×4): qty 2

## 2016-06-24 MED ORDER — VITAMIN D 1000 UNITS PO TABS
1000.0000 [IU] | ORAL_TABLET | Freq: Every day | ORAL | Status: DC
  Administered 2016-06-24 – 2016-06-25 (×2): 1000 [IU] via ORAL
  Filled 2016-06-24 (×2): qty 1

## 2016-06-24 MED ORDER — CHLORHEXIDINE GLUCONATE 4 % EX LIQD: 60.0000 mL | Freq: Once | CUTANEOUS | Status: DC

## 2016-06-24 MED ORDER — BUPIVACAINE HCL (PF) 0.25 % IJ SOLN
INTRAMUSCULAR | Status: AC
  Filled 2016-06-24: qty 30

## 2016-06-24 MED ORDER — PHENYLEPHRINE HCL 10 MG/ML IJ SOLN
INTRAVENOUS | Status: DC | PRN
  Administered 2016-06-24: 40 ug/min via INTRAVENOUS

## 2016-06-24 MED ORDER — GEMFIBROZIL 600 MG PO TABS
600.0000 mg | ORAL_TABLET | Freq: Two times a day (BID) | ORAL | Status: DC
  Administered 2016-06-25 (×2): 600 mg via ORAL
  Filled 2016-06-24 (×2): qty 1

## 2016-06-24 MED ORDER — POTASSIUM CHLORIDE IN NACL 20-0.9 MEQ/L-% IV SOLN
INTRAVENOUS | Status: DC
  Administered 2016-06-24: 15:00:00 via INTRAVENOUS
  Filled 2016-06-24 (×3): qty 1000

## 2016-06-24 MED ORDER — HYDROMORPHONE HCL 1 MG/ML IJ SOLN: 0.2500 mg | INTRAMUSCULAR | Status: DC | PRN

## 2016-06-24 MED ORDER — PHENYLEPHRINE 40 MCG/ML (10ML) SYRINGE FOR IV PUSH (FOR BLOOD PRESSURE SUPPORT)
PREFILLED_SYRINGE | INTRAVENOUS | Status: AC
  Filled 2016-06-24: qty 20

## 2016-06-24 MED ORDER — TAMSULOSIN HCL 0.4 MG PO CAPS
0.4000 mg | ORAL_CAPSULE | Freq: Every evening | ORAL | Status: DC
  Administered 2016-06-24: 0.4 mg via ORAL
  Filled 2016-06-24: qty 1

## 2016-06-24 MED ORDER — DOCUSATE SODIUM 100 MG PO CAPS
100.0000 mg | ORAL_CAPSULE | Freq: Two times a day (BID) | ORAL | Status: DC
  Administered 2016-06-24 – 2016-06-25 (×3): 100 mg via ORAL
  Filled 2016-06-24 (×3): qty 1

## 2016-06-24 SURGICAL SUPPLY — 68 items
BANDAGE ESMARK 6X9 LF (GAUZE/BANDAGES/DRESSINGS) ×1 IMPLANT
BENZOIN TINCTURE PRP APPL 2/3 (GAUZE/BANDAGES/DRESSINGS) ×2 IMPLANT
BLADE SAGITTAL 25.0X1.19X90 (BLADE) ×2 IMPLANT
BLADE SAW SGTL 13X75X1.27 (BLADE) ×2 IMPLANT
BLADE SURG 10 STRL SS (BLADE) ×4 IMPLANT
BNDG ELASTIC 6X15 VLCR STRL LF (GAUZE/BANDAGES/DRESSINGS) ×2 IMPLANT
BNDG ESMARK 6X9 LF (GAUZE/BANDAGES/DRESSINGS) ×2
BOWL SMART MIX CTS (DISPOSABLE) ×2 IMPLANT
CAPT KNEE TOTAL 3 ATTUNE ×2 IMPLANT
CEMENT HV SMART SET (Cement) ×4 IMPLANT
CONT SPEC 4OZ CLIKSEAL STRL BL (MISCELLANEOUS) ×2 IMPLANT
COVER SURGICAL LIGHT HANDLE (MISCELLANEOUS) ×2 IMPLANT
CUFF TOURNIQUET SINGLE 34IN LL (TOURNIQUET CUFF) ×2 IMPLANT
CUFF TOURNIQUET SINGLE 44IN (TOURNIQUET CUFF) IMPLANT
DECANTER SPIKE VIAL GLASS SM (MISCELLANEOUS) ×2 IMPLANT
DRAPE EXTREMITY T 121X128X90 (DRAPE) ×2 IMPLANT
DRAPE HALF SHEET 40X57 (DRAPES) ×2 IMPLANT
DRAPE INCISE IOBAN 66X45 STRL (DRAPES) ×2 IMPLANT
DRAPE PROXIMA HALF (DRAPES) ×2 IMPLANT
DRAPE U-SHAPE 47X51 STRL (DRAPES) ×2 IMPLANT
DRSG AQUACEL AG ADV 3.5X14 (GAUZE/BANDAGES/DRESSINGS) ×2 IMPLANT
DURAPREP 26ML APPLICATOR (WOUND CARE) ×4 IMPLANT
ELECT CAUTERY BLADE 6.4 (BLADE) ×2 IMPLANT
ELECT REM PT RETURN 9FT ADLT (ELECTROSURGICAL) ×2
ELECTRODE REM PT RTRN 9FT ADLT (ELECTROSURGICAL) ×1 IMPLANT
FACESHIELD WRAPAROUND (MASK) ×2 IMPLANT
FILTER STRAW FLUID ASPIR (MISCELLANEOUS) ×2 IMPLANT
GLOVE BIO SURGEON STRL SZ7 (GLOVE) ×2 IMPLANT
GLOVE BIOGEL PI IND STRL 7.0 (GLOVE) ×1 IMPLANT
GLOVE BIOGEL PI IND STRL 7.5 (GLOVE) ×1 IMPLANT
GLOVE BIOGEL PI INDICATOR 7.0 (GLOVE) ×1
GLOVE BIOGEL PI INDICATOR 7.5 (GLOVE) ×1
GLOVE SS BIOGEL STRL SZ 7.5 (GLOVE) ×1 IMPLANT
GLOVE SUPERSENSE BIOGEL SZ 7.5 (GLOVE) ×1
GOWN STRL REUS W/ TWL LRG LVL3 (GOWN DISPOSABLE) ×1 IMPLANT
GOWN STRL REUS W/ TWL XL LVL3 (GOWN DISPOSABLE) ×2 IMPLANT
GOWN STRL REUS W/TWL LRG LVL3 (GOWN DISPOSABLE) ×1
GOWN STRL REUS W/TWL XL LVL3 (GOWN DISPOSABLE) ×2
HANDPIECE INTERPULSE COAX TIP (DISPOSABLE) ×1
HOOD PEEL AWAY FACE SHEILD DIS (HOOD) ×4 IMPLANT
IMMOBILIZER KNEE 22 UNIV (SOFTGOODS) ×2 IMPLANT
KIT BASIN OR (CUSTOM PROCEDURE TRAY) ×2 IMPLANT
KIT ROOM TURNOVER OR (KITS) ×2 IMPLANT
MANIFOLD NEPTUNE II (INSTRUMENTS) ×2 IMPLANT
MARKER SKIN DUAL TIP RULER LAB (MISCELLANEOUS) ×2 IMPLANT
NEEDLE 18GX1X1/2 (RX/OR ONLY) (NEEDLE) ×4 IMPLANT
NS IRRIG 1000ML POUR BTL (IV SOLUTION) ×2 IMPLANT
PACK TOTAL JOINT (CUSTOM PROCEDURE TRAY) ×2 IMPLANT
PAD ARMBOARD 7.5X6 YLW CONV (MISCELLANEOUS) ×2 IMPLANT
SET HNDPC FAN SPRY TIP SCT (DISPOSABLE) ×1 IMPLANT
STRIP CLOSURE SKIN 1/2X4 (GAUZE/BANDAGES/DRESSINGS) ×2 IMPLANT
SUCTION FRAZIER HANDLE 10FR (MISCELLANEOUS) ×1
SUCTION TUBE FRAZIER 10FR DISP (MISCELLANEOUS) ×1 IMPLANT
SUT MNCRL AB 3-0 PS2 18 (SUTURE) ×2 IMPLANT
SUT VIC AB 0 CT1 27 (SUTURE) ×2
SUT VIC AB 0 CT1 27XBRD ANBCTR (SUTURE) ×2 IMPLANT
SUT VIC AB 1 CT1 27 (SUTURE) ×2
SUT VIC AB 1 CT1 27XBRD ANBCTR (SUTURE) ×2 IMPLANT
SUT VIC AB 2-0 CT1 27 (SUTURE) ×2
SUT VIC AB 2-0 CT1 TAPERPNT 27 (SUTURE) ×2 IMPLANT
SYR 30ML LL (SYRINGE) ×2 IMPLANT
SYR TB 1ML LUER SLIP (SYRINGE) ×2 IMPLANT
TOWEL OR 17X24 6PK STRL BLUE (TOWEL DISPOSABLE) ×2 IMPLANT
TOWEL OR 17X26 10 PK STRL BLUE (TOWEL DISPOSABLE) ×2 IMPLANT
TRAY CATH 16FR W/PLASTIC CATH (SET/KITS/TRAYS/PACK) IMPLANT
TRAY FOLEY CATH 16FR SILVER (SET/KITS/TRAYS/PACK) ×2 IMPLANT
TUBE CONNECTING 12X1/4 (SUCTIONS) ×2 IMPLANT
YANKAUER SUCT BULB TIP NO VENT (SUCTIONS) ×2 IMPLANT

## 2016-06-24 NOTE — Anesthesia Procedure Notes (Signed)
Spinal  Patient location during procedure: OR Start time: 06/24/2016 9:40 AM End time: 06/24/2016 9:47 AM Staffing Anesthesiologist: Marcie Bal, Sanyiah Kanzler Performed: anesthesiologist  Preanesthetic Checklist Completed: patient identified, site marked, surgical consent, pre-op evaluation, timeout performed, IV checked, risks and benefits discussed and monitors and equipment checked Spinal Block Patient position: sitting Prep: Betadine and site prepped and draped Patient monitoring: heart rate, cardiac monitor, continuous pulse ox and blood pressure Approach: midline Location: L3-4 Injection technique: single-shot Needle Needle type: Pencan  Needle gauge: 24 G Needle length: 9 cm Assessment Sensory level: T10 Additional Notes Pt tolerated the procedure well.

## 2016-06-24 NOTE — Anesthesia Procedure Notes (Signed)
Anesthesia Regional Block:  Adductor canal block  Pre-Anesthetic Checklist: ,, timeout performed, Correct Patient, Correct Site, Correct Laterality, Correct Procedure, Correct Position, site marked, Risks and benefits discussed,  Surgical consent,  Pre-op evaluation,  At surgeon's request and post-op pain management  Laterality: Right  Prep: chloraprep       Needles:  Injection technique: Single-shot  Needle Type: Echogenic Needle     Needle Length: 9cm 9 cm Needle Gauge: 21 and 21 G    Additional Needles:  Procedures: ultrasound guided (picture in chart) Adductor canal block Narrative:  Start time: 06/24/2016 9:05 AM End time: 06/24/2016 9:15 AM Injection made incrementally with aspirations every 5 mL.  Performed by: Personally  Anesthesiologist: Jerriann Schrom  Additional Notes: Pt tolerated the procedure well.

## 2016-06-24 NOTE — Progress Notes (Signed)
Orthopedic Tech Progress Note Patient Details:  Lucas Pugh 1940/10/28 [id number]  CPM Right Knee CPM Right Knee: On Right Knee Flexion (Degrees): 90 Right Knee Extension (Degrees): 0 Additional Comments: Trapeze bar and foot roll   [name] 06/24/2016, 12:58 PM

## 2016-06-24 NOTE — Progress Notes (Addendum)
Rhydian Zepp is a 75 y.o. male patient admitted from PACU awake, alert - oriented  X 4 - no acute distress noted.  VSS - Blood pressure 124/77, pulse 94, temperature 97 F (36.1 C), resp. rate 20, height 5\' 6"  (1.676 m), weight 68 kg (150 lb), SpO2 96 %.    IV in place, occlusive dsg intact without redness.  Orientation to room, and floor completed.  Admission INP armband ID verified with patient/family, and in place.   SR up x 2, fall assessment complete, with patient and family able to verbalize understanding of risk associated with falls, and verbalized understanding to call nsg before up out of bed.  Call light within reach, patient able to voice, and demonstrate understanding. No evidence of skin break down noted on exam.  Admission nurse notified of admission.     Will cont to eval and treat per MD orders.  Theora Master, RN 06/24/2016 1:54 PM

## 2016-06-24 NOTE — Anesthesia Preprocedure Evaluation (Addendum)
Anesthesia Evaluation  Patient identified by MRN, date of birth, ID band Patient awake    Reviewed: Allergy & Precautions, NPO status , Patient's Chart, lab work & pertinent test results  Airway Mallampati: II   Neck ROM: full    Dental  (+) Teeth Intact, Dental Advisory Given   Pulmonary neg pulmonary ROS,    breath sounds clear to auscultation       Cardiovascular hypertension, Pt. on medications + Valvular Problems/Murmurs AS  Rhythm:regular Rate:Normal  Mild AS. Normal LV function. Cleared by cardiologist for surgery.   Neuro/Psych    GI/Hepatic   Endo/Other    Renal/GU      Musculoskeletal  (+) Arthritis , Osteoarthritis,    Abdominal   Peds  Hematology   Anesthesia Other Findings   Reproductive/Obstetrics                            Anesthesia Physical Anesthesia Plan  ASA: II  Anesthesia Plan: MAC, Regional and Spinal   Post-op Pain Management:  Regional for Post-op pain   Induction: Intravenous  Airway Management Planned: Simple Face Mask  Additional Equipment:   Intra-op Plan:   Post-operative Plan:   Informed Consent: I have reviewed the patients History and Physical, chart, labs and discussed the procedure including the risks, benefits and alternatives for the proposed anesthesia with the patient or authorized representative who has indicated his/her understanding and acceptance.   Dental advisory given  Plan Discussed with: CRNA, Anesthesiologist and Surgeon  Anesthesia Plan Comments:        Anesthesia Quick Evaluation

## 2016-06-24 NOTE — Evaluation (Signed)
Physical Therapy Evaluation Patient Details Name: Lucas Pugh MRN: [id number] DOB: 12/09/1940 Today's Date: 06/24/2016   History of Present Illness  Patient is a 75 y/o male with hx of Non Hodgkin's lymphoma, HTN, aortic stenosis presents s/p Rt TKA.   Clinical Impression  Patient presents with pain, nausea and post surgical deficits s/p above surgery. Tolerated transfers and gait training with Min guard assist for safety. Education re: knee precautions, zero degree knee, exercises etc. Pt plans to discharge home with support of wife. Pt independent and active PTA. His bedroom can be on the first level but his CPM machine will be upstairs so pt needs to be able to negotiate 1 flight of steps prior to d/c. Will follow acutely to maximize independence and mobility prior to return home.     Follow Up Recommendations Home health PT;Supervision for mobility/OOB;Supervision/Assistance - 24 hour    Equipment Recommendations  Rolling walker with 5" wheels    Recommendations for Other Services OT consult     Precautions / Restrictions Precautions Precautions: Knee Precaution Booklet Issued: No Precaution Comments: Reviewed no pillow under knee and precautions Required Braces or Orthoses: Knee Immobilizer - Right Restrictions Weight Bearing Restrictions: Yes RLE Weight Bearing: Weight bearing as tolerated      Mobility  Bed Mobility Overal bed mobility: Needs Assistance Bed Mobility: Supine to Sit     Supine to sit: Min guard;HOB elevated     General bed mobility comments: Increased time but no assist needed. Use of rail. No dizziness.  Transfers Overall transfer level: Needs assistance Equipment used: Rolling walker (2 wheeled) Transfers: Sit to/from Stand Sit to Stand: Min guard         General transfer comment: Min guard for safety. Transferred to chair post ambulation bout.  Ambulation/Gait Ambulation/Gait assistance: Min guard Ambulation Distance (Feet): 40  Feet Assistive device: Rolling walker (2 wheeled) Gait Pattern/deviations: Step-to pattern;Step-through pattern;Decreased stance time - right;Decreased step length - left;Trunk flexed Gait velocity: decreased   General Gait Details: Decreased stance time RLE with mild knee instability but no buckling.   Stairs            Wheelchair Mobility    Modified Rankin (Stroke Patients Only)       Balance Overall balance assessment: Needs assistance Sitting-balance support: Feet supported;No upper extremity supported Sitting balance-Leahy Scale: Good     Standing balance support: During functional activity Standing balance-Leahy Scale: Poor Standing balance comment: Reliant on BUEs for support in standing.                             Pertinent Vitals/Pain Pain Assessment: 0-10 Pain Score: 4  Pain Location: right knee Pain Descriptors / Indicators: Operative site guarding;Sore Pain Intervention(s): Monitored during session;Repositioned;Premedicated before session    [place] expects to be discharged to:: Private residence Living Arrangements: Spouse/significant other Available Help at Discharge: Family;Available 24 hours/day Type of Home: House Home Access: Stairs to enter Entrance Stairs-Rails: Can reach both Entrance Stairs-Number of Steps: 2 Home Layout: Two level;Able to live on main level with bedroom/bathroom (will have CPM on second level but will be sleeping on first level) Home Equipment: Cane - single point;Bedside commode      Prior Function Level of Independence: Independent with assistive device(s)         Comments: uses SPC for community ambulation and independent for household. Drives. Mows lawn, wood works.     Hand Dominance  Extremity/Trunk Assessment   Upper Extremity Assessment: Defer to OT evaluation           Lower Extremity Assessment: RLE deficits/detail RLE Deficits / Details: Limited  AROM/strength secondary to post op/pain       Communication   Communication: HOH (has hearing aides)  Cognition Arousal/Alertness: Awake/alert Behavior During Therapy: WFL for tasks assessed/performed Overall Cognitive Status: Within Functional Limits for tasks assessed                      General Comments General comments (skin integrity, edema, etc.): Pt reporting nausea post ambulation. RN notified.    Exercises Total Joint Exercises Ankle Circles/Pumps: Both;10 reps;Supine Quad Sets: Both;10 reps;Supine Gluteal Sets: Both;10 reps;Supine   Assessment/Plan    PT Assessment Patient needs continued PT services  PT Problem List Decreased strength;Decreased mobility;Decreased range of motion;Decreased activity tolerance;Decreased balance;Pain;Impaired sensation          PT Treatment Interventions Gait training;DME instruction;Therapeutic activities;Therapeutic exercise;Patient/family education;Balance training;Stair training;Functional mobility training    PT Goals (Current goals can be found in the Care Plan section)  Acute Rehab PT Goals Patient Stated Goal: to get back to mowing my lawn and wood working PT Goal Formulation: With patient Time For Goal Achievement: 07/08/16 Potential to Achieve Goals: Good    Frequency 7X/week   Barriers to discharge Inaccessible home environment stairs to get into home and to upstairs    Co-evaluation               End of Session Equipment Utilized During Treatment: Gait belt Activity Tolerance: Patient tolerated treatment well Patient left: in chair;with call bell/phone within reach Nurse Communication: Mobility status         Time: [id number] PT Time Calculation (min) (ACUTE ONLY): 24 min   Charges:   PT Evaluation $PT Eval Low Complexity: 1 Procedure PT Treatments $Gait Training: 8-22 mins   PT G Codes:        Machai Desmith A Sherrin Stahle 06/24/2016, 4:13 PM Wray Kearns, [place], DPT [phone]

## 2016-06-24 NOTE — Transfer of Care (Signed)
Immediate Anesthesia Transfer of Care Note  Patient: [name]  Procedure(s) Performed: Procedure(s): RIGHT TOTAL KNEE ARTHROPLASTY (Right)  Patient Location: PACU  Anesthesia Type:MAC, Spinal and MAC combined with regional for post-op pain  Level of Consciousness: awake, alert , oriented and patient cooperative  Airway & Oxygen Therapy: Patient Spontanous Breathing and Patient connected to nasal cannula oxygen  Post-op Assessment: Report given to RN and Post -op Vital signs reviewed and stable  Post vital signs: Reviewed and stable  Last Vitals:  Vitals:   06/24/16 0910 06/24/16 1145  BP: 130/70   Pulse: 82   Resp: 13   Temp:  (P) 36.4 C    Last Pain:  Vitals:   06/24/16 0819  TempSrc:   PainSc: 3          Complications: No apparent anesthesia complications

## 2016-06-24 NOTE — Interval H&P Note (Signed)
History and Physical Interval Note:  06/24/2016 8:53 AM  Lucas Pugh  has presented today for surgery, with the diagnosis of OA RIGHT KNEE  The various methods of treatment have been discussed with the patient and family. After consideration of risks, benefits and other options for treatment, the patient has consented to  Procedure(s): RIGHT TOTAL KNEE ARTHROPLASTY (Right) as a surgical intervention .  The patient's history has been reviewed, patient examined, no change in status, stable for surgery.  I have reviewed the patient's chart and labs.  Questions were answered to the patient's satisfaction.     [name]

## 2016-06-24 NOTE — Anesthesia Postprocedure Evaluation (Signed)
Anesthesia Post Note  Patient: Lucas Pugh  Procedure(s) Performed: Procedure(s) (LRB): RIGHT TOTAL KNEE ARTHROPLASTY (Right)  Patient location during evaluation: PACU Anesthesia Type: Spinal Level of consciousness: oriented and awake and alert Pain management: pain level controlled Vital Signs Assessment: post-procedure vital signs reviewed and stable Respiratory status: spontaneous breathing, respiratory function stable and patient connected to nasal cannula oxygen Cardiovascular status: blood pressure returned to baseline and stable Postop Assessment: no headache and no backache Anesthetic complications: no    Last Vitals:  Vitals:   06/24/16 1200 06/24/16 1215  BP: 104/67 109/69  Pulse: 80 78  Resp: 15 13  Temp:      Last Pain:  Vitals:   06/24/16 1215  TempSrc:   PainSc: 0-No pain                 Loletha Bertini S

## 2016-06-24 NOTE — Op Note (Signed)
MRN:     [id number] DOB/AGE:    Dec 19, 1940 / 75 y.o.       OPERATIVE REPORT    DATE OF PROCEDURE:  06/24/2016       PREOPERATIVE DIAGNOSIS:   PRIMARY LOCALIZED OA RIGHT KNEE      Estimated body mass index is 24.21 kg/m as calculated from the following:   Height as of 06/14/16: 5\' 6"  (1.676 m).   Weight as of this encounter: 68 kg (150 lb).                                                        POSTOPERATIVE DIAGNOSIS:   SAME                                                                     PROCEDURE:  Procedure(s): RIGHT TOTAL KNEE ARTHROPLASTY Using Depuy Attune RP implants #6 Femur, #6Tibia, 22mm  RP bearing, 35 Patella     SURGEON: Lucas Pugh,Lucas Pugh    ASSISTANT:  Lucas Shepperson PA-C   (Present and scrubbed throughout the case, critical for assistance with exposure, retraction, instrumentation, and closure.)         ANESTHESIA: Spinal with Adductor Nerve Block     TOURNIQUET TIME: 0000000   COMPLICATIONS:  None     SPECIMENS: None   INDICATIONS FOR PROCEDURE: The patient has  OA RIGHT KNEE, varus deformities, XR shows bone on bone arthritis. Patient has failed all conservative measures including anti-inflammatory medicines, narcotics, attempts at  exercise and weight loss, cortisone injections and viscosupplementation.  Risks and benefits of surgery have been discussed, questions answered.   DESCRIPTION OF PROCEDURE: The patient identified by armband, received  right femoral nerve block and IV antibiotics, in the holding area at [facility]. Patient taken to the operating room, appropriate anesthetic  monitors were attached Spinal anesthesia induced with  the patient in supine position, Foley catheter was inserted. Tourniquet  applied high to the operative thigh. Lateral post and foot positioner  applied to the table, the lower extremity was then prepped and draped  in usual sterile fashion from the ankle to the tourniquet. Time-out procedure was performed. The limb  was wrapped with an Esmarch bandage and the tourniquet inflated to 365 mmHg. We began the operation by making the anterior midline incision starting at handbreadth above the patella going over the patella 1 cm medial to and  4 cm distal to the tibial tubercle. Small bleeders in the skin and the  subcutaneous tissue identified and cauterized. Transverse retinaculum was incised and reflected medially and [name] was accomplished. the patella was everted and theprepatellar fat pad resected. The superficial medial collateral  ligament was then elevated from anterior to posterior along the proximal  flare of the tibia and anterior half of the menisci resected. The knee was hyperflexed exposing bone on bone arthritis. Peripheral and notch osteophytes as well as the cruciate ligaments were then resected. We continued to  work our way around posteriorly along the proximal tibia, and externally  rotated the tibia subluxing it out from  underneath the femur. [name]  retractor was placed through the notch and [name]  placed, and we then drilled through the proximal tibia in line with the  axis of the tibia followed by an intramedullary guide rod and 2-degree  posterior slope cutting guide. The tibial cutting guide was pinned into place  allowing resection of 4 mm of bone medially and about 6 mm of bone  laterally because of her varus deformity. Satisfied with the tibial resection, we then  entered the distal femur 2 mm anterior to the PCL origin with the  intramedullary guide rod and applied the distal femoral cutting guide  set at 29mm, with 5 degrees of valgus. This was pinned along the  epicondylar axis. At this point, the distal femoral cut was accomplished without difficulty. We then sized for [name] and pinned the guide in 3 degrees of external rotation.The chamfer cutting guide was pinned into place. The anterior, posterior, and chamfer cuts  were accomplished without difficulty followed by  the  RP box cutting guide and the box cut. We also removed posterior osteophytes from the posterior femoral condyles. At this  time, the knee was brought into full extension. We checked our  extension and flexion gaps and found them symmetric at 41mm.  The patella thickness measured at 25 mm. We set the cutting guide at 15 and removed the posterior 9.5-10 mm  of the patella sized for 35 button and drilled the lollipop. The knee  was then once again hyperflexed exposing the proximal tibia. We sized for Pugh #6 tibial base plate, applied the smokestack and the conical reamer followed by the the Delta fin keel punch. We then hammered into place the  RP trial femoral component, inserted [name], trial patellar button, and took the knee through range of motion from 0-130 degrees. No thumb pressure was required for patellar  tracking. At this point, all trial components were removed, [name]  was mixed and applied to all bony metallic mating surfaces except for the posterior condyles of the femur itself. In order, we  hammered into place the tibial tray and removed excess cement, the femoral component and removed excess cement, [name]  was inserted, and the knee brought to full extension with compression.  The patellar button was clamped into place, and excess cement  removed. While the cement cured the wound was irrigated out with normal saline solution pulse lavage.. Ligament stability and patellar tracking were checked and found to be excellent.. The parapatellar arthrotomy was closed with  #1 Vicryl suture. The subcutaneous tissue with 0 and 2-0 undyed  Vicryl suture, and 4-0 Monocryl.. [name],  4 x 4, dressing sponges, Webril, and Ace wrap applied. Needle and sponge count were correct times 2.The patient awakened, extubated, and taken to recovery room without difficulty. Vascular status was  normal, pulses 2+ and symmetric.   Lucas Pugh,Lucas Pugh 06/24/2016, 11:14 AM

## 2016-06-24 NOTE — Anesthesia Procedure Notes (Signed)
Procedure Name: MAC Date/Time: 06/24/2016 9:38 AM Performed by: Carney Living Pre-anesthesia Checklist: Patient identified, Emergency Drugs available, Suction available, Patient being monitored and Timeout performed Patient Re-evaluated:Patient Re-evaluated prior to inductionOxygen Delivery Method: Nasal cannula

## 2016-06-25 ENCOUNTER — Encounter (HOSPITAL_COMMUNITY): Payer: Self-pay | Admitting: Orthopedic Surgery

## 2016-06-25 LAB — CBC
HCT: 34.1 % — ABNORMAL LOW (ref 39.0–52.0)
HEMOGLOBIN: 11.9 g/dL — AB (ref 13.0–17.0)
MCH: 32.5 pg (ref 26.0–34.0)
MCHC: 34.9 g/dL (ref 30.0–36.0)
MCV: 93.2 fL (ref 78.0–100.0)
Platelets: 200 10*3/uL (ref 150–400)
RBC: 3.66 MIL/uL — ABNORMAL LOW (ref 4.22–5.81)
RDW: 13.1 % (ref 11.5–15.5)
WBC: 16.8 10*3/uL — ABNORMAL HIGH (ref 4.0–10.5)

## 2016-06-25 LAB — BASIC METABOLIC PANEL
Anion gap: 6 (ref 5–15)
BUN: 15 mg/dL (ref 6–20)
CALCIUM: 9 mg/dL (ref 8.9–10.3)
CHLORIDE: 110 mmol/L (ref 101–111)
CO2: 22 mmol/L (ref 22–32)
CREATININE: 1.18 mg/dL (ref 0.61–1.24)
GFR calc Af Amer: >60 mL/min (ref >60)
GFR calc non Af Amer: 58 mL/min — ABNORMAL LOW (ref >60)
Glucose, Bld: 183 mg/dL — ABNORMAL HIGH (ref 65–99)
Potassium: 4.1 mmol/L (ref 3.5–5.1)
SODIUM: 138 mmol/L (ref 135–145)

## 2016-06-25 MED ORDER — DOCUSATE SODIUM 100 MG PO CAPS: ORAL_CAPSULE | ORAL | 0 refills | Status: DC

## 2016-06-25 MED ORDER — OXYCODONE HCL 5 MG PO TABS: ORAL_TABLET | ORAL | 0 refills | Status: DC

## 2016-06-25 MED ORDER — ASPIRIN 325 MG PO TBEC: DELAYED_RELEASE_TABLET | ORAL | 0 refills | Status: DC

## 2016-06-25 MED ORDER — ACETAMINOPHEN 325 MG PO TABS: 650.0000 mg | ORAL_TABLET | Freq: Four times a day (QID) | ORAL | Status: DC | PRN

## 2016-06-25 MED ORDER — POLYETHYLENE GLYCOL 3350 17 G PO PACK: PACK | ORAL | 0 refills | Status: DC

## 2016-06-25 NOTE — Progress Notes (Signed)
Reviewed discharge information/medications with patient.  Answered all of their questions.  Patient is ready for discharge.

## 2016-06-25 NOTE — Care Management Note (Signed)
Case Management Note  Patient Details  Name: Lucas Pugh MRN: [id number] Date of Birth: 12-13-1940  Subjective/Objective:   75 yr old gentleman s/p right total knee arthroplasty.                 Action/Plan:  Case manager spoke with patient and wife concerning discharge plan and DME. Patient will go to Outpatient therapy starting on 06/27/16. Will have family support at discharge.   Expected Discharge Date:   06/25/16               Expected Discharge Plan:  Home/self care   In-House Referral:     Discharge planning Services  CM Consult  Post Acute Care Choice:  Durable Medical Equipment Choice offered to:     DME Arranged:  Walker rolling DME Agency:  [place] Arranged:  NA (Patient going to Outpatient therapy) [place] Agency:  NA  Status of Service:  Completed, signed off  If discussed at [place] of Stay Meetings, dates discussed:    Additional Comments:  Ninfa Meeker, RN 06/25/2016, 2:57 PM

## 2016-06-25 NOTE — Progress Notes (Signed)
Physical Therapy Treatment Patient Details Name: Lucas Pugh MRN: [id number] DOB: January 15, 1941 Today's Date: 06/25/2016    History of Present Illness Patient is a 75 y/o male with hx of Non Hodgkin's lymphoma, HTN, aortic stenosis presents s/p Rt TKA.     PT Comments    Patient is making good progress with PT.  From a mobility standpoint anticipate patient will be ready for DC home when medically ready.     Follow Up Recommendations  Home health PT;Supervision for mobility/OOB;Supervision/Assistance - 24 hour     Equipment Recommendations  Rolling walker with 5" wheels    Recommendations for Other Services OT consult     Precautions / Restrictions Precautions Precautions: Knee Precaution Comments: Reviewed no pillow under knee and precautions Restrictions Weight Bearing Restrictions: Yes RLE Weight Bearing: Weight bearing as tolerated    Mobility  Bed Mobility               General bed mobility comments: OOB in chair upon arrival  Transfers Overall transfer level: Modified independent Equipment used: Rolling walker (2 wheeled)                Ambulation/Gait Ambulation/Gait assistance: Supervision Ambulation Distance (Feet): 300 Feet Assistive device: Rolling walker (2 wheeled) Gait Pattern/deviations: Step-through pattern     General Gait Details: pt with increased WB on R LE and increased R knee flexion during swing phase with cues   Stairs            Wheelchair Mobility    Modified Rankin (Stroke Patients Only)       Balance                                    Cognition Arousal/Alertness: Awake/alert Behavior During Therapy: WFL for tasks assessed/performed Overall Cognitive Status: Within Functional Limits for tasks assessed                      Exercises Total Joint Exercises Knee Flexion: AROM;Right;5 reps;Other (comment) (20 sec holds)    General Comments General comments (skin integrity, edema, etc.):  wife present      Pertinent Vitals/Pain Pain Assessment: Faces Faces Pain Scale: Hurts a little bit Pain Location: R knee Pain Descriptors / Indicators: Sore Pain Intervention(s): Limited activity within patient's tolerance;Monitored during session;Premedicated before session;Repositioned    Home Living                      Prior Function            PT Goals (current goals can now be found in the care plan section) Acute Rehab PT Goals Patient Stated Goal: to get back to mowing my lawn and wood working Progress towards PT goals: Progressing toward goals    Frequency    7X/week      PT Plan Current plan remains appropriate    Co-evaluation             End of Session Equipment Utilized During Treatment: Gait belt Activity Tolerance: Patient tolerated treatment well Patient left: in chair;with call bell/phone within reach;with family/visitor present     Time: [id number] PT Time Calculation (min) (ACUTE ONLY): 23 min  Charges:  $Gait Training: 23-37 mins                    G Codes:      Salina April, PTA  Pager: [phone]   06/25/2016, 4:36 PM

## 2016-06-25 NOTE — Discharge Summary (Signed)
Patient ID: Lucas Pugh MRN: [id number] DOB/AGE: Sep 07, 1940 75 y.o.  Admit date: 06/24/2016 Discharge date: 06/25/2016  Admission Diagnoses:  Principal Problem:   Primary localized osteoarthritis of right knee Active Problems:   Heart murmur, aortic   Abnormal EKG   Hypertension   Non Hodgkin's lymphoma (HCC)   Primary localized osteoarthrosis of the knee, right   Discharge Diagnoses:  Same  Past Medical History:  Diagnosis Date  . Abnormal EKG    follow by [facility] in [place] Echo Old inferior MI  . Aortic stenosis    mild by 10/2015 echo  . Borderline diabetes   . Gout   . Heart murmur, aortic   . Hypertension   . Non Hodgkin's lymphoma ([place])    currently in remission since 2003  . Primary localized osteoarthritis of knee    right  . Primary localized osteoarthritis of right knee 06/12/2016  . Wears glasses   . Wears hearing aid    B/L    Surgeries: Procedure(s): RIGHT TOTAL KNEE ARTHROPLASTY on 06/24/2016   Consultants:   Discharged Condition: Improved  Hospital Course: Lucas Pugh is an 75 y.o. male who was admitted 06/24/2016 for operative treatment ofPrimary localized osteoarthritis of right knee. Patient has severe unremitting pain that affects sleep, daily activities, and work/hobbies. After pre-op clearance the patient was taken to the operating room on 06/24/2016 and underwent  Procedure(s): RIGHT TOTAL KNEE ARTHROPLASTY.    Patient was given perioperative antibiotics: Anti-infectives    Start     Dose/Rate Route Frequency Ordered Stop   06/24/16 1500  ceFAZolin (ANCEF) IVPB 2g/100 mL premix     2 g 200 mL/hr over 30 Minutes Intravenous Every 6 hours 06/24/16 1352 06/24/16 2110   06/24/16 0808  ceFAZolin (ANCEF) 2-4 GM/100ML-% IVPB    Comments:  Rosenberger, Meredit: cabinet override      06/24/16 0808 06/24/16 0943   06/24/16 0806  ceFAZolin (ANCEF) IVPB 2g/100 mL premix     2 g 200 mL/hr over 30 Minutes Intravenous On call to  O.R. 06/24/16 [id number] 06/24/16 0943       Patient was given sequential compression devices, early ambulation, and chemoprophylaxis to prevent DVT.  Patient benefited maximally from hospital stay and there were no complications.    Recent vital signs: Patient Vitals for the past 24 hrs:  BP Temp Temp src Pulse Resp SpO2  06/25/16 0400 129/80 98.1 F (36.7 C) Oral 99 16 96 %  06/25/16 0000 126/78 98.1 F (36.7 C) Oral 93 16 100 %  06/24/16 2050 (!) 133/92 98.8 F (37.1 C) Oral 99 17 97 %  06/24/16 1346 124/77 97 F (36.1 C) - 94 20 96 %  06/24/16 1330 - 97 F (36.1 C) - 84 12 99 %  06/24/16 1315 115/80 - - 81 11 99 %  06/24/16 1245 110/69 - - 77 17 99 %  06/24/16 1215 109/69 - - 78 13 100 %  06/24/16 1200 104/67 - - 80 15 98 %  06/24/16 1145 - 97.5 F (36.4 C) - - - -  06/24/16 0910 130/70 - - 82 13 99 %  06/24/16 0905 132/72 - - 87 11 100 %     Recent laboratory studies:  Recent Labs  06/25/16 0426  WBC 16.8*  HGB 11.9*  HCT 34.1*  PLT 200  NA 138  K 4.1  CL 110  CO2 22  BUN 15  CREATININE 1.18  GLUCOSE 183*  CALCIUM 9.0  Discharge Medications:     Medication List    TAKE these medications   acetaminophen 325 MG tablet Commonly known as:  TYLENOL Take 2 tablets (650 mg total) by mouth every 6 (six) hours as needed for mild pain (or Fever >/= 101).   allopurinol 300 MG tablet Commonly known as:  ZYLOPRIM Take 300 mg by mouth daily.   amLODipine 10 MG tablet Commonly known as:  NORVASC Take 10 mg by mouth daily.   aspirin 325 MG EC tablet 1 tab a day for the next 30 days to prevent blood clots What changed:  medication strength  how much to take  how to take this  when to take this  additional instructions   cholecalciferol 1000 units tablet Commonly known as:  VITAMIN D Take 1,000 Units by mouth daily.   docusate sodium 100 MG capsule Commonly known as:  COLACE 1 tab 2 times a day while on narcotics.  STOOL SOFTENER   gemfibrozil  600 MG tablet Commonly known as:  LOPID Take 600 mg by mouth 2 (two) times daily. In the morning and at noon   lisinopril 10 MG tablet Commonly known as:  PRINIVIL,ZESTRIL Take 10 mg by mouth every evening.   multivitamin with minerals Tabs tablet Take 1 tablet by mouth daily.   oxyCODONE 5 MG immediate release tablet Commonly known as:  Oxy IR/ROXICODONE 1-2 tablets every 4-6 hrs as needed for pain   polyethylene glycol packet Commonly known as:  MIRALAX / GLYCOLAX 17grams in 6 oz of water twice a day until bowel movement.  LAXITIVE.  Restart if two days since last bowel movement   pravastatin 20 MG tablet Commonly known as:  PRAVACHOL Take 20 mg by mouth every evening.   tamsulosin 0.4 MG Caps capsule Commonly known as:  FLOMAX Take 0.4 mg by mouth every evening.       Diagnostic Studies: No results found.  Disposition: Final discharge disposition not confirmed  Discharge Instructions    CPM    Complete by:  As directed    Continuous passive motion machine (CPM):      Use the CPM from 0 to 90 for 6 hours per day.       You may break it up into 2 or 3 sessions per day.      Use CPM for 2 weeks or until you are told to stop.   Call MD / Call 911    Complete by:  As directed    If you experience chest pain or shortness of breath, CALL 911 and be transported to the hospital emergency room.  If you develope a fever above 101 F, pus (white drainage) or increased drainage or redness at the wound, or calf pain, call your surgeon's office.   Change dressing    Complete by:  As directed    DO NOT REMOVE BANDAGE OVER SURGICAL INCISION.  [place] WHOLE LEG INCLUDING OVER THE WATERPROOF BANDAGE WITH SOAP AND WATER EVERY DAY.   Constipation Prevention    Complete by:  As directed    Drink plenty of fluids.  Prune juice may be helpful.  You may use a stool softener, such as Colace (over the counter) 100 mg twice a day.  Use MiraLax (over the counter) for constipation as needed.    Diet - low sodium heart healthy    Complete by:  As directed    Discharge instructions    Complete by:  As directed    INSTRUCTIONS AFTER  JOINT REPLACEMENT   Remove items at home which could result in a fall. This includes throw rugs or furniture in walking pathways ICE to the affected joint every three hours while awake for 30 minutes at a time, for at least the first 3-5 days, and then as needed for pain and swelling.  Continue to use ice for pain and swelling. You may notice swelling that will progress down to the foot and ankle.  This is normal after surgery.  Elevate your leg when you are not up walking on it.   Continue to use the breathing machine you got in the hospital (incentive spirometer) which will help keep your temperature down.  It is common for your temperature to cycle up and down following surgery, especially at night when you are not up moving around and exerting yourself.  The breathing machine keeps your lungs expanded and your temperature down.   DIET:  As you were doing prior to hospitalization, we recommend a well-balanced diet.  DRESSING / WOUND CARE / SHOWERING  Keep the surgical dressing until follow up.  The dressing is water proof, so you can shower without any extra covering.  IF THE DRESSING FALLS OFF or the wound gets wet inside, change the dressing with sterile gauze.  Please use good hand washing techniques before changing the dressing.  Do not use any lotions or creams on the incision until instructed by your surgeon.    ACTIVITY  Increase activity slowly as tolerated, but follow the weight bearing instructions below.   No driving for 6 weeks or until further direction given by your physician.  You cannot drive while taking narcotics.  No lifting or carrying greater than 10 lbs. until further directed by your surgeon. Avoid periods of inactivity such as sitting longer than an hour when not asleep. This helps prevent blood clots.  You may return to work once  you are authorized by your doctor.     WEIGHT BEARING   Weight bearing as tolerated with assist device (walker, cane, etc) as directed, use it as long as suggested by your surgeon or therapist, typically at least 2-3 weeks.   EXERCISES  Results after joint replacement surgery are often greatly improved when you follow the exercise, range of motion and muscle strengthening exercises prescribed by your doctor. Safety measures are also important to protect the joint from further injury. Any time any of these exercises cause you to have increased pain or swelling, decrease what you are doing until you are comfortable again and then slowly increase them. If you have problems or questions, call your caregiver or physical therapist for advice.   Rehabilitation is important following a joint replacement. After just a few days of immobilization, the muscles of the leg can become weakened and shrink (atrophy).  These exercises are designed to build up the tone and strength of the thigh and leg muscles and to improve motion. Often times heat used for twenty to thirty minutes before working out will loosen up your tissues and help with improving the range of motion but do not use heat for the first two weeks following surgery (sometimes heat can increase post-operative swelling).   These exercises can be done on a training (exercise) mat, on the floor, on a table or on a bed. Use whatever works the best and is most comfortable for you.    Use music or television while you are exercising so that the exercises are a pleasant break in your day. This  will make your life better with the exercises acting as a break in your routine that you can look forward to.   Perform all exercises about fifteen times, three times per day or as directed.  You should exercise both the operative leg and the other leg as well.   Exercises include:  Quad Sets - Tighten up the muscle on the front of the thigh (Quad) and hold for 5-10  seconds.   Straight Leg Raises - With your knee straight (if you were given a brace, keep it on), lift the leg to 60 degrees, hold for 3 seconds, and slowly lower the leg.  Perform this exercise against resistance later as your leg gets stronger.  Leg Slides: Lying on your back, slowly slide your foot toward your buttocks, bending your knee up off the floor (only go as far as is comfortable). Then slowly slide your foot back down until your leg is flat on the floor again.  Angel Wings: Lying on your back spread your legs to the side as far apart as you can without causing discomfort.  Hamstring Strength:  Lying on your back, push your heel against the floor with your leg straight by tightening up the muscles of your buttocks.  Repeat, but this time bend your knee to a comfortable angle, and push your heel against the floor.  You may put a pillow under the heel to make it more comfortable if necessary.   A rehabilitation program following joint replacement surgery can speed recovery and prevent re-injury in the future due to weakened muscles. Contact your doctor or a physical therapist for more information on knee rehabilitation.    CONSTIPATION  Constipation is defined medically as fewer than three stools per week and severe constipation as less than one stool per week.  Even if you have a regular bowel pattern at home, your normal regimen is likely to be disrupted due to multiple reasons following surgery.  Combination of anesthesia, postoperative narcotics, change in appetite and fluid intake all can affect your bowels.   YOU MUST use at least one of the following options; they are listed in order of increasing strength to get the job done.  They are all available over the counter, and you may need to use some, POSSIBLY even all of these options:    Drink plenty of fluids (prune juice may be helpful) and high fiber foods Colace 100 mg by mouth twice a day  Senokot for constipation as directed and  as needed Dulcolax (bisacodyl), take with full glass of water  Miralax (polyethylene glycol) once or twice a day as needed.  If you have tried all these things and are unable to have a bowel movement in the first 3-4 days after surgery call either your surgeon or your primary doctor.    If you experience loose stools or diarrhea, hold the medications until you stool forms back up.  If your symptoms do not get better within 1 week or if they get worse, check with your doctor.  If you experience "the worst abdominal pain ever" or develop nausea or vomiting, please contact the office immediately for further recommendations for treatment.   ITCHING:  If you experience itching with your medications, try taking only a single pain pill, or even half a pain pill at a time.  You can also use Benadryl over the counter for itching or also to help with sleep.   TED HOSE STOCKINGS:  Use stockings on both legs until  for at least 2 weeks or as directed by physician office. They may be removed at night for sleeping.  MEDICATIONS:  See your medication summary on the "After Visit Summary" that nursing will review with you.  You may have some home medications which will be placed on hold until you complete the course of blood thinner medication.  It is important for you to complete the blood thinner medication as prescribed.  PRECAUTIONS:  If you experience chest pain or shortness of breath - call 911 immediately for transfer to the hospital emergency department.   If you develop a fever greater that 101 F, purulent drainage from wound, increased redness or drainage from wound, foul odor from the wound/dressing, or calf pain - CONTACT YOUR SURGEON.                                                   FOLLOW-UP APPOINTMENTS:  If you do not already have a post-op appointment, please call the office for an appointment to be seen by your surgeon.  Guidelines for how soon to be seen are listed in your "After Visit Summary",  but are typically between 1-4 weeks after surgery.  OTHER INSTRUCTIONS:   Knee Replacement:  Do not place pillow under knee, focus on keeping the knee straight while resting. CPM instructions: 0-90 degrees, 2 hours in the morning, 2 hours in the afternoon, and 2 hours in the evening. Place foam block, curve side up under heel at all times except when in CPM or when walking.  DO NOT modify, tear, cut, or change the foam block in any way.  MAKE SURE YOU:  Understand these instructions.  Get help right away if you are not doing well or get worse.    Thank you for letting us be a part of your medical care team.  It is a privilege we respect greatly.  We hope these instructions will help you stay on track for a fast and full recovery!   Do not put a pillow under the knee. Place it under the heel.    Complete by:  As directed    Place gray foam block, curve side up under heel at all times except when in CPM or when walking.  DO NOT modify, tear, cut, or change in any way the gray foam block.   Increase activity slowly as tolerated    Complete by:  As directed    Patient may shower    Complete by:  As directed    Aquacel dressing is water proof    Wash over it and the whole leg with soap and water at the end of your shower   TED hose    Complete by:  As directed    Use stockings (TED hose) for 2 weeks on both leg(s).  You may remove them at night for sleeping.      Follow-up Information    Lorn Junes, MD Follow up on 07/08/2016.   Specialty:  Orthopedic Surgery Why:  appt time 2 pm Contact information: [address] [id number] [place] Follow up on 06/27/2016.   Specialties:  Physical Therapy, Occupational Therapy Why:  appt time as schedule on Thursday 06/27/16 Contact information: [address] [place] [address] [phone]  SignedLinda Hedges 06/25/2016, 9:03  AM

## 2016-06-25 NOTE — Progress Notes (Signed)
Orthopedic Tech Progress Note Patient Details:  Lucas Pugh 12/03/1940 [id number]  Patient ID: Lucas Pugh, male   DOB: 05-Apr-1941, 75 y.o.   MRN: [id number] Applied cpm 0-60  Karolee Stamps 06/25/2016, 6:26 AM

## 2016-06-25 NOTE — Progress Notes (Signed)
Physical Therapy Treatment Patient Details Name: Lucas Pugh MRN: [id number] DOB: Mar 29, 1941 Today's Date: 06/25/2016    History of Present Illness Patient is a 75 y/o male with hx of Non Hodgkin's lymphoma, HTN, aortic stenosis presents s/p Rt TKA.     PT Comments    Patient is progressing very well toward mobility goals. Continue to progress as tolerated.   Follow Up Recommendations  Home health PT;Supervision for mobility/OOB;Supervision/Assistance - 24 hour     Equipment Recommendations  Rolling walker with 5" wheels    Recommendations for Other Services OT consult     Precautions / Restrictions Precautions Precautions: Knee Precaution Comments: Reviewed no pillow under knee and precautions Restrictions Weight Bearing Restrictions: Yes RLE Weight Bearing: Weight bearing as tolerated    Mobility  Bed Mobility               General bed mobility comments: OOB in chair upon arrival  Transfers Overall transfer level: Needs assistance Equipment used: Rolling walker (2 wheeled) Transfers: Sit to/from Stand Sit to Stand: Supervision         General transfer comment: supervision for safety  Ambulation/Gait Ambulation/Gait assistance: Supervision Ambulation Distance (Feet): 225 Feet Assistive device: Rolling walker (2 wheeled) Gait Pattern/deviations: Step-through pattern Gait velocity: decreased   General Gait Details: pt with improved gait mechanics and step length symmetry; pt with little reliance on UE support   Stairs Stairs: Yes Stairs assistance: Min guard Stair Management: No rails;One rail Right;Sideways;Backwards;With walker Number of Stairs:  (2X2) General stair comments: pt practiced backwards with RW and sideways with R hand rail to simulate stairs at home; cues for sequencing and technique  Wheelchair Mobility    Modified Rankin (Stroke Patients Only)       Balance                                    Cognition  Arousal/Alertness: Awake/alert Behavior During Therapy: WFL for tasks assessed/performed Overall Cognitive Status: Within Functional Limits for tasks assessed                      Exercises Total Joint Exercises Quad Sets: AROM;Right;10 reps Heel Slides: AROM;Right;10 reps Hip ABduction/ADduction: AROM;Right;10 reps Straight Leg Raises: AROM;Right;10 reps Goniometric ROM: 93 degrees flexion    General Comments        Pertinent Vitals/Pain Pain Assessment: Faces Faces Pain Scale: Hurts a little bit Pain Location: R knee Pain Descriptors / Indicators: Burning Pain Intervention(s): Limited activity within patient's tolerance;Monitored during session;Premedicated before session;Repositioned    Home Living                      Prior Function            PT Goals (current goals can now be found in the care plan section) Acute Rehab PT Goals Patient Stated Goal: to get back to mowing my lawn and wood working Progress towards PT goals: Progressing toward goals    Frequency    7X/week      PT Plan Current plan remains appropriate    Co-evaluation             End of Session Equipment Utilized During Treatment: Gait belt Activity Tolerance: Patient tolerated treatment well Patient left: in chair;with call bell/phone within reach     Time: 0939-1004 PT Time Calculation (min) (ACUTE ONLY): 25 min  Charges:  $Gait Training:  8-22 mins $Therapeutic Exercise: 8-22 mins                    G Codes:      [place] April, PTA Pager: [phone]   06/25/2016, 10:10 AM

## 2016-06-25 NOTE — Progress Notes (Signed)
Orthopedic Tech Progress Note Patient Details:  Lucas Pugh August 02, 1940 [id number]  CPM Right Knee CPM Right Knee: On Right Knee Flexion (Degrees): 60 Right Knee Extension (Degrees): 0 Additional Comments: zero degree knee donned   [name] 06/25/2016, 1:08 PM

## 2016-06-27 DIAGNOSIS — M25561 Pain in right knee: Secondary | ICD-10-CM | POA: Diagnosis not present

## 2016-06-27 DIAGNOSIS — M25661 Stiffness of right knee, not elsewhere classified: Secondary | ICD-10-CM | POA: Diagnosis not present

## 2016-06-27 DIAGNOSIS — M1711 Unilateral primary osteoarthritis, right knee: Secondary | ICD-10-CM | POA: Diagnosis not present

## 2016-07-01 ENCOUNTER — Other Ambulatory Visit (HOSPITAL_COMMUNITY): Payer: Self-pay | Admitting: Orthopedic Surgery

## 2016-07-01 ENCOUNTER — Ambulatory Visit (HOSPITAL_COMMUNITY)
Admission: RE | Admit: 2016-07-01 | Discharge: 2016-07-01 | Disposition: A | Discharge status: Home / Self Care | Payer: Medicare Other | Source: Ambulatory Visit | Attending: Orthopedic Surgery | Admitting: Orthopedic Surgery

## 2016-07-01 DIAGNOSIS — M79661 Pain in right lower leg: Secondary | ICD-10-CM

## 2016-07-01 DIAGNOSIS — M7989 Other specified soft tissue disorders: Secondary | ICD-10-CM

## 2016-07-01 DIAGNOSIS — M1711 Unilateral primary osteoarthritis, right knee: Secondary | ICD-10-CM | POA: Diagnosis not present

## 2016-07-01 NOTE — Progress Notes (Signed)
*  PRELIMINARY RESULTS* Vascular Ultrasound Right lower extremity venous duplex  has been completed.  Preliminary findings: No evidence of deep vein thrombosis or baker's cysts in the right lower extremity.   Everrett Coombe 07/01/2016, 5:10 PM

## 2016-07-02 DIAGNOSIS — M25561 Pain in right knee: Secondary | ICD-10-CM | POA: Diagnosis not present

## 2016-07-02 DIAGNOSIS — M1711 Unilateral primary osteoarthritis, right knee: Secondary | ICD-10-CM | POA: Diagnosis not present

## 2016-07-02 DIAGNOSIS — M25661 Stiffness of right knee, not elsewhere classified: Secondary | ICD-10-CM | POA: Diagnosis not present

## 2016-07-04 DIAGNOSIS — M25661 Stiffness of right knee, not elsewhere classified: Secondary | ICD-10-CM | POA: Diagnosis not present

## 2016-07-04 DIAGNOSIS — M25561 Pain in right knee: Secondary | ICD-10-CM | POA: Diagnosis not present

## 2016-07-04 DIAGNOSIS — M1711 Unilateral primary osteoarthritis, right knee: Secondary | ICD-10-CM | POA: Diagnosis not present

## 2016-07-05 DIAGNOSIS — M1711 Unilateral primary osteoarthritis, right knee: Secondary | ICD-10-CM | POA: Diagnosis not present

## 2016-07-05 DIAGNOSIS — M25661 Stiffness of right knee, not elsewhere classified: Secondary | ICD-10-CM | POA: Diagnosis not present

## 2016-07-05 DIAGNOSIS — M25561 Pain in right knee: Secondary | ICD-10-CM | POA: Diagnosis not present

## 2016-07-08 DIAGNOSIS — Z96651 Presence of right artificial knee joint: Secondary | ICD-10-CM | POA: Diagnosis not present

## 2016-07-09 DIAGNOSIS — M1711 Unilateral primary osteoarthritis, right knee: Secondary | ICD-10-CM | POA: Diagnosis not present

## 2016-07-09 DIAGNOSIS — M25561 Pain in right knee: Secondary | ICD-10-CM | POA: Diagnosis not present

## 2016-07-09 DIAGNOSIS — M25661 Stiffness of right knee, not elsewhere classified: Secondary | ICD-10-CM | POA: Diagnosis not present

## 2016-07-11 DIAGNOSIS — M25661 Stiffness of right knee, not elsewhere classified: Secondary | ICD-10-CM | POA: Diagnosis not present

## 2016-07-11 DIAGNOSIS — M1711 Unilateral primary osteoarthritis, right knee: Secondary | ICD-10-CM | POA: Diagnosis not present

## 2016-07-11 DIAGNOSIS — M25561 Pain in right knee: Secondary | ICD-10-CM | POA: Diagnosis not present

## 2016-07-12 DIAGNOSIS — M25661 Stiffness of right knee, not elsewhere classified: Secondary | ICD-10-CM | POA: Diagnosis not present

## 2016-07-12 DIAGNOSIS — M1711 Unilateral primary osteoarthritis, right knee: Secondary | ICD-10-CM | POA: Diagnosis not present

## 2016-07-12 DIAGNOSIS — M25561 Pain in right knee: Secondary | ICD-10-CM | POA: Diagnosis not present

## 2016-07-15 DIAGNOSIS — M1711 Unilateral primary osteoarthritis, right knee: Secondary | ICD-10-CM | POA: Diagnosis not present

## 2016-07-15 DIAGNOSIS — M25661 Stiffness of right knee, not elsewhere classified: Secondary | ICD-10-CM | POA: Diagnosis not present

## 2016-07-15 DIAGNOSIS — M25561 Pain in right knee: Secondary | ICD-10-CM | POA: Diagnosis not present

## 2016-07-17 DIAGNOSIS — M25661 Stiffness of right knee, not elsewhere classified: Secondary | ICD-10-CM | POA: Diagnosis not present

## 2016-07-17 DIAGNOSIS — M25561 Pain in right knee: Secondary | ICD-10-CM | POA: Diagnosis not present

## 2016-07-17 DIAGNOSIS — M1711 Unilateral primary osteoarthritis, right knee: Secondary | ICD-10-CM | POA: Diagnosis not present

## 2016-07-18 DIAGNOSIS — M1711 Unilateral primary osteoarthritis, right knee: Secondary | ICD-10-CM | POA: Diagnosis not present

## 2016-07-18 DIAGNOSIS — M25661 Stiffness of right knee, not elsewhere classified: Secondary | ICD-10-CM | POA: Diagnosis not present

## 2016-07-18 DIAGNOSIS — M25561 Pain in right knee: Secondary | ICD-10-CM | POA: Diagnosis not present

## 2016-07-23 DIAGNOSIS — M25661 Stiffness of right knee, not elsewhere classified: Secondary | ICD-10-CM | POA: Diagnosis not present

## 2016-07-23 DIAGNOSIS — M1711 Unilateral primary osteoarthritis, right knee: Secondary | ICD-10-CM | POA: Diagnosis not present

## 2016-07-23 DIAGNOSIS — M25561 Pain in right knee: Secondary | ICD-10-CM | POA: Diagnosis not present

## 2016-07-25 DIAGNOSIS — M25661 Stiffness of right knee, not elsewhere classified: Secondary | ICD-10-CM | POA: Diagnosis not present

## 2016-07-25 DIAGNOSIS — M1711 Unilateral primary osteoarthritis, right knee: Secondary | ICD-10-CM | POA: Diagnosis not present

## 2016-07-25 DIAGNOSIS — Z96651 Presence of right artificial knee joint: Secondary | ICD-10-CM | POA: Diagnosis not present

## 2016-07-25 DIAGNOSIS — M25561 Pain in right knee: Secondary | ICD-10-CM | POA: Diagnosis not present

## 2016-07-26 DIAGNOSIS — M25561 Pain in right knee: Secondary | ICD-10-CM | POA: Diagnosis not present

## 2016-07-26 DIAGNOSIS — M25661 Stiffness of right knee, not elsewhere classified: Secondary | ICD-10-CM | POA: Diagnosis not present

## 2016-07-26 DIAGNOSIS — M1711 Unilateral primary osteoarthritis, right knee: Secondary | ICD-10-CM | POA: Diagnosis not present

## 2016-07-30 DIAGNOSIS — M1711 Unilateral primary osteoarthritis, right knee: Secondary | ICD-10-CM | POA: Diagnosis not present

## 2016-07-30 DIAGNOSIS — M25561 Pain in right knee: Secondary | ICD-10-CM | POA: Diagnosis not present

## 2016-07-30 DIAGNOSIS — M25661 Stiffness of right knee, not elsewhere classified: Secondary | ICD-10-CM | POA: Diagnosis not present

## 2016-07-31 DIAGNOSIS — M25561 Pain in right knee: Secondary | ICD-10-CM | POA: Diagnosis not present

## 2016-07-31 DIAGNOSIS — M25661 Stiffness of right knee, not elsewhere classified: Secondary | ICD-10-CM | POA: Diagnosis not present

## 2016-07-31 DIAGNOSIS — M1711 Unilateral primary osteoarthritis, right knee: Secondary | ICD-10-CM | POA: Diagnosis not present

## 2016-08-02 DIAGNOSIS — M25661 Stiffness of right knee, not elsewhere classified: Secondary | ICD-10-CM | POA: Diagnosis not present

## 2016-08-02 DIAGNOSIS — M1711 Unilateral primary osteoarthritis, right knee: Secondary | ICD-10-CM | POA: Diagnosis not present

## 2016-08-02 DIAGNOSIS — M25561 Pain in right knee: Secondary | ICD-10-CM | POA: Diagnosis not present

## 2016-08-05 DIAGNOSIS — M25561 Pain in right knee: Secondary | ICD-10-CM | POA: Diagnosis not present

## 2016-08-05 DIAGNOSIS — M1711 Unilateral primary osteoarthritis, right knee: Secondary | ICD-10-CM | POA: Diagnosis not present

## 2016-08-05 DIAGNOSIS — M25661 Stiffness of right knee, not elsewhere classified: Secondary | ICD-10-CM | POA: Diagnosis not present

## 2016-08-07 DIAGNOSIS — M25561 Pain in right knee: Secondary | ICD-10-CM | POA: Diagnosis not present

## 2016-08-07 DIAGNOSIS — M1711 Unilateral primary osteoarthritis, right knee: Secondary | ICD-10-CM | POA: Diagnosis not present

## 2016-08-07 DIAGNOSIS — M25661 Stiffness of right knee, not elsewhere classified: Secondary | ICD-10-CM | POA: Diagnosis not present

## 2016-08-12 DIAGNOSIS — M25661 Stiffness of right knee, not elsewhere classified: Secondary | ICD-10-CM | POA: Diagnosis not present

## 2016-08-12 DIAGNOSIS — M1711 Unilateral primary osteoarthritis, right knee: Secondary | ICD-10-CM | POA: Diagnosis not present

## 2016-08-12 DIAGNOSIS — M25561 Pain in right knee: Secondary | ICD-10-CM | POA: Diagnosis not present

## 2016-08-19 DIAGNOSIS — M25661 Stiffness of right knee, not elsewhere classified: Secondary | ICD-10-CM | POA: Diagnosis not present

## 2016-08-19 DIAGNOSIS — M25561 Pain in right knee: Secondary | ICD-10-CM | POA: Diagnosis not present

## 2016-08-19 DIAGNOSIS — M1711 Unilateral primary osteoarthritis, right knee: Secondary | ICD-10-CM | POA: Diagnosis not present

## 2016-08-21 DIAGNOSIS — M25561 Pain in right knee: Secondary | ICD-10-CM | POA: Diagnosis not present

## 2016-08-21 DIAGNOSIS — M25661 Stiffness of right knee, not elsewhere classified: Secondary | ICD-10-CM | POA: Diagnosis not present

## 2016-08-21 DIAGNOSIS — M1711 Unilateral primary osteoarthritis, right knee: Secondary | ICD-10-CM | POA: Diagnosis not present

## 2016-08-22 DIAGNOSIS — Z96651 Presence of right artificial knee joint: Secondary | ICD-10-CM | POA: Diagnosis not present

## 2016-08-26 DIAGNOSIS — M25661 Stiffness of right knee, not elsewhere classified: Secondary | ICD-10-CM | POA: Diagnosis not present

## 2016-08-26 DIAGNOSIS — M1711 Unilateral primary osteoarthritis, right knee: Secondary | ICD-10-CM | POA: Diagnosis not present

## 2016-08-26 DIAGNOSIS — M25561 Pain in right knee: Secondary | ICD-10-CM | POA: Diagnosis not present

## 2016-08-28 DIAGNOSIS — M25661 Stiffness of right knee, not elsewhere classified: Secondary | ICD-10-CM | POA: Diagnosis not present

## 2016-08-28 DIAGNOSIS — M25561 Pain in right knee: Secondary | ICD-10-CM | POA: Diagnosis not present

## 2016-08-28 DIAGNOSIS — M1711 Unilateral primary osteoarthritis, right knee: Secondary | ICD-10-CM | POA: Diagnosis not present

## 2016-09-02 DIAGNOSIS — M25561 Pain in right knee: Secondary | ICD-10-CM | POA: Diagnosis not present

## 2016-09-02 DIAGNOSIS — M25661 Stiffness of right knee, not elsewhere classified: Secondary | ICD-10-CM | POA: Diagnosis not present

## 2016-09-02 DIAGNOSIS — M1711 Unilateral primary osteoarthritis, right knee: Secondary | ICD-10-CM | POA: Diagnosis not present

## 2016-09-04 DIAGNOSIS — M1711 Unilateral primary osteoarthritis, right knee: Secondary | ICD-10-CM | POA: Diagnosis not present

## 2016-09-04 DIAGNOSIS — M25661 Stiffness of right knee, not elsewhere classified: Secondary | ICD-10-CM | POA: Diagnosis not present

## 2016-09-04 DIAGNOSIS — M25561 Pain in right knee: Secondary | ICD-10-CM | POA: Diagnosis not present

## 2016-09-09 DIAGNOSIS — M25561 Pain in right knee: Secondary | ICD-10-CM | POA: Diagnosis not present

## 2016-09-09 DIAGNOSIS — M25661 Stiffness of right knee, not elsewhere classified: Secondary | ICD-10-CM | POA: Diagnosis not present

## 2016-09-09 DIAGNOSIS — M1711 Unilateral primary osteoarthritis, right knee: Secondary | ICD-10-CM | POA: Diagnosis not present

## 2016-09-11 DIAGNOSIS — M25661 Stiffness of right knee, not elsewhere classified: Secondary | ICD-10-CM | POA: Diagnosis not present

## 2016-09-11 DIAGNOSIS — M25561 Pain in right knee: Secondary | ICD-10-CM | POA: Diagnosis not present

## 2016-09-11 DIAGNOSIS — M1711 Unilateral primary osteoarthritis, right knee: Secondary | ICD-10-CM | POA: Diagnosis not present

## 2016-09-16 DIAGNOSIS — M25561 Pain in right knee: Secondary | ICD-10-CM | POA: Diagnosis not present

## 2016-09-16 DIAGNOSIS — M1711 Unilateral primary osteoarthritis, right knee: Secondary | ICD-10-CM | POA: Diagnosis not present

## 2016-09-16 DIAGNOSIS — M25661 Stiffness of right knee, not elsewhere classified: Secondary | ICD-10-CM | POA: Diagnosis not present

## 2016-09-18 DIAGNOSIS — M1711 Unilateral primary osteoarthritis, right knee: Secondary | ICD-10-CM | POA: Diagnosis not present

## 2016-09-18 DIAGNOSIS — M25561 Pain in right knee: Secondary | ICD-10-CM | POA: Diagnosis not present

## 2016-09-18 DIAGNOSIS — M25661 Stiffness of right knee, not elsewhere classified: Secondary | ICD-10-CM | POA: Diagnosis not present

## 2016-09-19 DIAGNOSIS — Z96651 Presence of right artificial knee joint: Secondary | ICD-10-CM | POA: Diagnosis not present

## 2016-11-21 DIAGNOSIS — I10 Essential (primary) hypertension: Secondary | ICD-10-CM | POA: Diagnosis not present

## 2016-11-21 DIAGNOSIS — I358 Other nonrheumatic aortic valve disorders: Secondary | ICD-10-CM | POA: Diagnosis not present

## 2016-11-21 DIAGNOSIS — R9431 Abnormal electrocardiogram [ECG] [EKG]: Secondary | ICD-10-CM | POA: Diagnosis not present

## 2016-11-21 DIAGNOSIS — E119 Type 2 diabetes mellitus without complications: Secondary | ICD-10-CM | POA: Diagnosis not present

## 2016-11-25 DIAGNOSIS — I358 Other nonrheumatic aortic valve disorders: Secondary | ICD-10-CM | POA: Diagnosis not present

## 2017-01-21 DIAGNOSIS — C8592 Non-Hodgkin lymphoma, unspecified, intrathoracic lymph nodes: Secondary | ICD-10-CM | POA: Diagnosis not present

## 2017-01-21 DIAGNOSIS — M1A09X0 Idiopathic chronic gout, multiple sites, without tophus (tophi): Secondary | ICD-10-CM | POA: Diagnosis not present

## 2017-01-21 DIAGNOSIS — E7800 Pure hypercholesterolemia, unspecified: Secondary | ICD-10-CM | POA: Diagnosis not present

## 2017-01-21 DIAGNOSIS — Z7689 Persons encountering health services in other specified circumstances: Secondary | ICD-10-CM | POA: Diagnosis not present

## 2017-01-21 DIAGNOSIS — L409 Psoriasis, unspecified: Secondary | ICD-10-CM | POA: Diagnosis not present

## 2017-01-21 DIAGNOSIS — R7301 Impaired fasting glucose: Secondary | ICD-10-CM | POA: Diagnosis not present

## 2017-01-21 DIAGNOSIS — C8255 Diffuse follicle center lymphoma, lymph nodes of inguinal region and lower limb: Secondary | ICD-10-CM | POA: Diagnosis not present

## 2017-01-21 DIAGNOSIS — N400 Enlarged prostate without lower urinary tract symptoms: Secondary | ICD-10-CM | POA: Diagnosis not present

## 2017-01-21 DIAGNOSIS — I10 Essential (primary) hypertension: Secondary | ICD-10-CM | POA: Diagnosis not present

## 2017-03-13 DIAGNOSIS — Z96651 Presence of right artificial knee joint: Secondary | ICD-10-CM | POA: Diagnosis not present

## 2017-03-18 DIAGNOSIS — M109 Gout, unspecified: Secondary | ICD-10-CM | POA: Diagnosis not present

## 2017-03-18 DIAGNOSIS — E785 Hyperlipidemia, unspecified: Secondary | ICD-10-CM | POA: Diagnosis not present

## 2017-03-18 DIAGNOSIS — Z6825 Body mass index (BMI) 25.0-25.9, adult: Secondary | ICD-10-CM | POA: Diagnosis not present

## 2017-03-18 DIAGNOSIS — L219 Seborrheic dermatitis, unspecified: Secondary | ICD-10-CM | POA: Diagnosis not present

## 2017-03-18 DIAGNOSIS — M25561 Pain in right knee: Secondary | ICD-10-CM | POA: Diagnosis not present

## 2017-03-18 DIAGNOSIS — I10 Essential (primary) hypertension: Secondary | ICD-10-CM | POA: Diagnosis not present

## 2017-03-18 DIAGNOSIS — Z96659 Presence of unspecified artificial knee joint: Secondary | ICD-10-CM | POA: Diagnosis not present

## 2017-03-18 DIAGNOSIS — C8200 Follicular lymphoma grade I, unspecified site: Secondary | ICD-10-CM | POA: Diagnosis not present

## 2017-03-18 DIAGNOSIS — M1990 Unspecified osteoarthritis, unspecified site: Secondary | ICD-10-CM | POA: Diagnosis not present

## 2017-03-18 DIAGNOSIS — N329 Bladder disorder, unspecified: Secondary | ICD-10-CM | POA: Diagnosis not present

## 2017-03-18 DIAGNOSIS — N400 Enlarged prostate without lower urinary tract symptoms: Secondary | ICD-10-CM | POA: Diagnosis not present

## 2017-05-19 DIAGNOSIS — L570 Actinic keratosis: Secondary | ICD-10-CM | POA: Diagnosis not present

## 2017-05-19 DIAGNOSIS — L738 Other specified follicular disorders: Secondary | ICD-10-CM | POA: Diagnosis not present

## 2017-05-19 DIAGNOSIS — L298 Other pruritus: Secondary | ICD-10-CM | POA: Diagnosis not present

## 2017-05-19 DIAGNOSIS — L814 Other melanin hyperpigmentation: Secondary | ICD-10-CM | POA: Diagnosis not present

## 2017-05-26 DIAGNOSIS — M1712 Unilateral primary osteoarthritis, left knee: Secondary | ICD-10-CM | POA: Diagnosis not present

## 2017-05-27 DIAGNOSIS — Z23 Encounter for immunization: Secondary | ICD-10-CM | POA: Diagnosis not present

## 2017-06-16 DIAGNOSIS — M1712 Unilateral primary osteoarthritis, left knee: Secondary | ICD-10-CM | POA: Diagnosis not present

## 2017-06-24 DIAGNOSIS — C8200 Follicular lymphoma grade I, unspecified site: Secondary | ICD-10-CM | POA: Diagnosis not present

## 2017-06-24 DIAGNOSIS — Z23 Encounter for immunization: Secondary | ICD-10-CM | POA: Diagnosis not present

## 2017-06-26 DIAGNOSIS — M1712 Unilateral primary osteoarthritis, left knee: Secondary | ICD-10-CM | POA: Diagnosis not present

## 2017-06-26 DIAGNOSIS — M25562 Pain in left knee: Secondary | ICD-10-CM | POA: Diagnosis not present

## 2017-06-26 DIAGNOSIS — S83242A Other tear of medial meniscus, current injury, left knee, initial encounter: Secondary | ICD-10-CM | POA: Diagnosis not present

## 2017-06-26 DIAGNOSIS — M7122 Synovial cyst of popliteal space [Baker], left knee: Secondary | ICD-10-CM | POA: Diagnosis not present

## 2017-06-26 DIAGNOSIS — R937 Abnormal findings on diagnostic imaging of other parts of musculoskeletal system: Secondary | ICD-10-CM | POA: Diagnosis not present

## 2017-07-01 DIAGNOSIS — M1712 Unilateral primary osteoarthritis, left knee: Secondary | ICD-10-CM | POA: Diagnosis not present

## 2017-07-01 DIAGNOSIS — M25561 Pain in right knee: Secondary | ICD-10-CM | POA: Diagnosis not present

## 2017-07-23 DIAGNOSIS — I10 Essential (primary) hypertension: Secondary | ICD-10-CM | POA: Diagnosis not present

## 2017-07-23 DIAGNOSIS — Z7689 Persons encountering health services in other specified circumstances: Secondary | ICD-10-CM | POA: Diagnosis not present

## 2017-07-23 DIAGNOSIS — R7301 Impaired fasting glucose: Secondary | ICD-10-CM | POA: Diagnosis not present

## 2017-07-23 DIAGNOSIS — C8592 Non-Hodgkin lymphoma, unspecified, intrathoracic lymph nodes: Secondary | ICD-10-CM | POA: Diagnosis not present

## 2017-07-23 DIAGNOSIS — M1A09X0 Idiopathic chronic gout, multiple sites, without tophus (tophi): Secondary | ICD-10-CM | POA: Diagnosis not present

## 2017-07-23 DIAGNOSIS — N400 Enlarged prostate without lower urinary tract symptoms: Secondary | ICD-10-CM | POA: Diagnosis not present

## 2017-07-23 DIAGNOSIS — E7800 Pure hypercholesterolemia, unspecified: Secondary | ICD-10-CM | POA: Diagnosis not present

## 2017-08-05 DIAGNOSIS — R300 Dysuria: Secondary | ICD-10-CM | POA: Diagnosis not present

## 2017-08-05 DIAGNOSIS — N390 Urinary tract infection, site not specified: Secondary | ICD-10-CM | POA: Diagnosis not present

## 2017-08-12 ENCOUNTER — Other Ambulatory Visit: Payer: Self-pay

## 2017-08-12 ENCOUNTER — Encounter: Payer: Self-pay | Admitting: Family Medicine

## 2017-08-12 ENCOUNTER — Ambulatory Visit (INDEPENDENT_AMBULATORY_CARE_PROVIDER_SITE_OTHER): Payer: Medicare Other | Admitting: Family Medicine

## 2017-08-12 VITALS — BP 120/58 | HR 66 | Temp 97.9°F | Resp 16 | Ht 63.0 in | Wt 150.0 lb

## 2017-08-12 DIAGNOSIS — S83207A Unspecified tear of unspecified meniscus, current injury, left knee, initial encounter: Secondary | ICD-10-CM | POA: Diagnosis not present

## 2017-08-12 DIAGNOSIS — I350 Nonrheumatic aortic (valve) stenosis: Secondary | ICD-10-CM

## 2017-08-12 DIAGNOSIS — R7303 Prediabetes: Secondary | ICD-10-CM

## 2017-08-12 DIAGNOSIS — I10 Essential (primary) hypertension: Secondary | ICD-10-CM | POA: Diagnosis not present

## 2017-08-12 DIAGNOSIS — M109 Gout, unspecified: Secondary | ICD-10-CM | POA: Insufficient documentation

## 2017-08-12 DIAGNOSIS — M1712 Unilateral primary osteoarthritis, left knee: Secondary | ICD-10-CM | POA: Diagnosis not present

## 2017-08-12 DIAGNOSIS — C8280 Other types of follicular lymphoma, unspecified site: Secondary | ICD-10-CM

## 2017-08-12 DIAGNOSIS — M1A49X0 Other secondary chronic gout, multiple sites, without tophus (tophi): Secondary | ICD-10-CM

## 2017-08-12 MED ORDER — ASPIRIN EC 81 MG PO TBEC: 81.0000 mg | DELAYED_RELEASE_TABLET | Freq: Every day | ORAL | Status: DC

## 2017-08-12 NOTE — Patient Instructions (Addendum)
[place] of records- [facility]. [facility]  Release of [place]  Release of records- [facility]  Release of records- Dr. Irene Shipper office  F/U 4 months

## 2017-08-12 NOTE — Progress Notes (Signed)
Subjective:    Patient ID: Lucas Pugh, male    DOB: November 20, 1940, 77 y.o.   MRN: [id number]  Patient presents for [facility] (is fasting)  She here to establish care.  He was being followed by Dr. Wenda Overland (deceased) Significant past medical history.  Followed by cardiology Dr. Rosana Hoes in [place] secondary to hypertension hyperlipidemia, aortic stenosis He is on statin drug as well as his fenofibrate No known CAD    He has history of  Follicular non-Hodgkin's lymphoma currently in remission his oncologist is Dr. Lonia Chimera in [facility] Dx 2003 Left inguinal lymoh node, he was in [place] at time of diagnosis , had 8 cycles of " R CHOP"  Now fllows once a year   Osteoarthritis with knee replacement on the right side by Dr. Noemi Chapel orthopedics   Has tear in Left meniscus, A knee, bakers cyst, stress fracture of tibial plataeu  History of borderline diabetes mellitus no current medications to treat this.  He is on statin drug as well as hypertensive medications. Highest A1C 6.7%, now 5.5% on last   Gout- on allourinol and colchicine as needed for flares, non recenlty  Psoriais treated at the [facility] has seen Dr. Kizzie Fantasia yearly   [place]- ophthalmology at [facility] now, has early macular degeneration    Chronic edema wears comprerssion hose, left from inguinal biopsy, right since knee replacement   Exercides at [place] in ER  1 Week ago for UTI, given Macrobid has 3 days left, also on miralax and metamucil for constipation   Reviewed some notes in chart  Review Of Systems:  GEN- denies fatigue, fever, weight loss,weakness, recent illness HEENT- denies eye drainage, change in vision, nasal discharge, CVS- denies chest pain, palpitations RESP- denies SOB, cough, wheeze ABD- denies N/V, change in stools, abd pain GU- denies dysuria, hematuria, dribbling, incontinence MSK-+ joint pain, muscle aches, injury Neuro- denies headache, dizziness, syncope,  seizure activity       Objective:    BP (!) 120/58   Pulse 66   Temp 97.9 F (36.6 C) (Oral)   Resp 16   Ht 5\' 3"  (1.6 m)   Wt 150 lb (68 kg)   SpO2 99%   BMI 26.57 kg/m  GEN- NAD, alert and oriented x3,WALKS with [place] ,wears hearing aides  HEENT- PERRL, EOMI, non injected sclera, pink conjunctiva, MMM, oropharynx clear, TM clear bilat  Neck- Supple, no thyromegaly, no appreciable bruit  CVS- RRR, 3/6  Murmur radies throiugh precordium  RESP-CTAB ABD-NABS,soft,NT,ND EXT- No edema Pulses- Radial, DP- 2+        Assessment & Plan:      Problem List Items Addressed This Visit      Unprioritized   Hypertension   Relevant Medications   aspirin EC 81 MG tablet   Non Hodgkin's lymphoma ([place]) - Primary    In remission, followed by oncology      Relevant Medications   ibuprofen (ADVIL,MOTRIN) 200 MG tablet   aspirin EC 81 MG tablet   Gout   Borderline diabetes    Obtain recent fasting labs, no current meds      Aortic stenosis    Followed by cardiology no symptoms, BP well controlled      Relevant Medications   aspirin EC 81 MG tablet   Acute meniscal tear of left knee    appt with orthopedics this evening He would need cardiac clearance prior to surgery Will obtain records  Note: This dictation was prepared with Dragon dictation along with smaller phrase technology. Any transcriptional errors that result from this process are unintentional.

## 2017-08-12 NOTE — Assessment & Plan Note (Addendum)
appt with orthopedics this evening He would need cardiac clearance prior to surgery Will obtain records

## 2017-08-12 NOTE — Assessment & Plan Note (Signed)
Obtain recent fasting labs, no current meds

## 2017-08-12 NOTE — Assessment & Plan Note (Signed)
Followed by cardiology no symptoms, BP well controlled

## 2017-08-12 NOTE — Assessment & Plan Note (Signed)
In remission, followed by oncology

## 2017-08-14 ENCOUNTER — Other Ambulatory Visit (INDEPENDENT_AMBULATORY_CARE_PROVIDER_SITE_OTHER): Payer: Self-pay | Admitting: Radiology

## 2017-08-14 ENCOUNTER — Telehealth (INDEPENDENT_AMBULATORY_CARE_PROVIDER_SITE_OTHER): Payer: Self-pay | Admitting: Radiology

## 2017-08-14 NOTE — Telephone Encounter (Signed)
error 

## 2017-08-27 ENCOUNTER — Other Ambulatory Visit: Payer: Self-pay | Admitting: *Deleted

## 2017-08-27 MED ORDER — ALLOPURINOL 300 MG PO TABS: 300.0000 mg | ORAL_TABLET | Freq: Every day | ORAL | 3 refills | Status: DC

## 2017-08-28 ENCOUNTER — Other Ambulatory Visit: Payer: Self-pay | Admitting: *Deleted

## 2017-08-28 MED ORDER — PRAVASTATIN SODIUM 20 MG PO TABS: 20.0000 mg | ORAL_TABLET | Freq: Every evening | ORAL | 3 refills | Status: DC

## 2017-09-22 ENCOUNTER — Other Ambulatory Visit: Payer: Self-pay | Admitting: *Deleted

## 2017-09-22 MED ORDER — GEMFIBROZIL 600 MG PO TABS: 600.0000 mg | ORAL_TABLET | Freq: Two times a day (BID) | ORAL | 3 refills | Status: DC

## 2017-09-30 ENCOUNTER — Other Ambulatory Visit: Payer: Self-pay | Admitting: *Deleted

## 2017-09-30 MED ORDER — AMLODIPINE BESYLATE 10 MG PO TABS: 10.0000 mg | ORAL_TABLET | Freq: Every day | ORAL | 3 refills | Status: DC

## 2017-10-07 DIAGNOSIS — M1712 Unilateral primary osteoarthritis, left knee: Secondary | ICD-10-CM | POA: Diagnosis not present

## 2017-10-14 ENCOUNTER — Other Ambulatory Visit: Payer: Self-pay | Admitting: *Deleted

## 2017-10-14 MED ORDER — TAMSULOSIN HCL 0.4 MG PO CAPS: 0.4000 mg | ORAL_CAPSULE | Freq: Every evening | ORAL | 3 refills | Status: DC

## 2017-11-25 DIAGNOSIS — I10 Essential (primary) hypertension: Secondary | ICD-10-CM | POA: Diagnosis not present

## 2017-11-25 DIAGNOSIS — I358 Other nonrheumatic aortic valve disorders: Secondary | ICD-10-CM | POA: Diagnosis not present

## 2017-11-25 DIAGNOSIS — I350 Nonrheumatic aortic (valve) stenosis: Secondary | ICD-10-CM | POA: Diagnosis not present

## 2017-11-25 DIAGNOSIS — R9431 Abnormal electrocardiogram [ECG] [EKG]: Secondary | ICD-10-CM | POA: Diagnosis not present

## 2017-12-12 ENCOUNTER — Encounter: Payer: Self-pay | Admitting: Family Medicine

## 2017-12-12 ENCOUNTER — Other Ambulatory Visit: Payer: Self-pay

## 2017-12-12 ENCOUNTER — Ambulatory Visit (INDEPENDENT_AMBULATORY_CARE_PROVIDER_SITE_OTHER): Payer: Medicare Other | Admitting: Family Medicine

## 2017-12-12 VITALS — BP 118/62 | HR 74 | Temp 97.9°F | Resp 14 | Ht 63.0 in | Wt 155.0 lb

## 2017-12-12 DIAGNOSIS — M85862 Other specified disorders of bone density and structure, left lower leg: Secondary | ICD-10-CM | POA: Diagnosis not present

## 2017-12-12 DIAGNOSIS — I10 Essential (primary) hypertension: Secondary | ICD-10-CM

## 2017-12-12 DIAGNOSIS — M1711 Unilateral primary osteoarthritis, right knee: Secondary | ICD-10-CM

## 2017-12-12 DIAGNOSIS — R7303 Prediabetes: Secondary | ICD-10-CM | POA: Diagnosis not present

## 2017-12-12 DIAGNOSIS — M85861 Other specified disorders of bone density and structure, right lower leg: Secondary | ICD-10-CM

## 2017-12-12 NOTE — Progress Notes (Signed)
   Subjective:    Patient ID: Lucas Pugh, male    DOB: 1941-03-22, 77 y.o.   MRN: [id number]  Patient presents for Follow-up (is not fasting)  Pt here to f/u chronic medical problems    Continues to have knee pain, planning to proceed with knee replacement by Raliegh Ip. Told he has thinnining bone, he would like bone density   Seen by cardiologist told he has been doing well , BP has been controlled.  he has been cleared for surgery  Seen at [facility] in Jan, no specific problems  Due for fasting labs        Review Of Systems:  GEN- denies fatigue, fever, weight loss,weakness, recent illness HEENT- denies eye drainage, change in vision, nasal discharge, CVS- denies chest pain, palpitations RESP- denies SOB, cough, wheeze ABD- denies N/V, change in stools, abd pain GU- denies dysuria, hematuria, dribbling, incontinence MSK- + joint pain, muscle aches, injury Neuro- denies headache, dizziness, syncope, seizure activity       Objective:    BP 118/62   Pulse 74   Temp 97.9 F (36.6 C) (Oral)   Resp 14   Ht 5\' 3"  (1.6 m)   Wt 155 lb (70.3 kg)   SpO2 98%   BMI 27.46 kg/m  GEN- NAD, alert and oriented x3,WALKS with Cane , HEENT- PERRL, EOMI, non injected sclera, pink conjunctiva, MMM, oropharynx clear, TM clear bilat  Neck- Supple, no thyromegaly CVS- RRR, 3/6  Murmur  RESP-CTAB EXT- No edema Pulses- Radial  2+       Assessment & Plan:      Problem List Items Addressed This Visit      Unprioritized   Primary localized osteoarthrosis of the knee, right    Cleared by cardiology for knee replacement      Hypertension    Well controlled no changes  Return for fasting labs Check lipids      Borderline diabetes    Check A1C       Other Visit Diagnoses    Osteopenia of both lower legs    -  Primary   obtain bone density      Note: This dictation was prepared with Dragon dictation along with smaller phrase technology. Any transcriptional errors that  result from this process are unintentional.

## 2017-12-12 NOTE — Patient Instructions (Addendum)
Bone Density to be done  Return for fasting labs  F/U 6 months for Physical

## 2017-12-14 ENCOUNTER — Encounter: Payer: Self-pay | Admitting: Family Medicine

## 2017-12-14 NOTE — Assessment & Plan Note (Signed)
Check A1C 

## 2017-12-14 NOTE — Assessment & Plan Note (Signed)
Well controlled no changes  Return for fasting labs Check lipids

## 2017-12-14 NOTE — Assessment & Plan Note (Signed)
Cleared by cardiology for knee replacement

## 2017-12-16 ENCOUNTER — Other Ambulatory Visit: Payer: Medicare Other

## 2017-12-16 DIAGNOSIS — M85862 Other specified disorders of bone density and structure, left lower leg: Secondary | ICD-10-CM | POA: Diagnosis not present

## 2017-12-16 DIAGNOSIS — R7303 Prediabetes: Secondary | ICD-10-CM

## 2017-12-16 DIAGNOSIS — M85861 Other specified disorders of bone density and structure, right lower leg: Secondary | ICD-10-CM | POA: Diagnosis not present

## 2017-12-16 DIAGNOSIS — I10 Essential (primary) hypertension: Secondary | ICD-10-CM

## 2017-12-16 DIAGNOSIS — M1711 Unilateral primary osteoarthritis, right knee: Secondary | ICD-10-CM | POA: Diagnosis not present

## 2017-12-16 DIAGNOSIS — M1712 Unilateral primary osteoarthritis, left knee: Secondary | ICD-10-CM | POA: Diagnosis not present

## 2017-12-17 ENCOUNTER — Other Ambulatory Visit: Payer: Self-pay

## 2017-12-17 DIAGNOSIS — M85862 Other specified disorders of bone density and structure, left lower leg: Secondary | ICD-10-CM

## 2017-12-17 DIAGNOSIS — M85861 Other specified disorders of bone density and structure, right lower leg: Secondary | ICD-10-CM

## 2017-12-17 DIAGNOSIS — M1711 Unilateral primary osteoarthritis, right knee: Secondary | ICD-10-CM

## 2017-12-17 LAB — COMPREHENSIVE METABOLIC PANEL
AG Ratio: 2 (calc) (ref 1.0–2.5)
ALBUMIN MSPROF: 4.3 g/dL (ref 3.6–5.1)
ALT: 11 U/L (ref 9–46)
AST: 13 U/L (ref 10–35)
Alkaline phosphatase (APISO): 57 U/L (ref 40–115)
BUN: 19 mg/dL (ref 7–25)
CHLORIDE: 107 mmol/L (ref 98–110)
CO2: 24 mmol/L (ref 20–32)
CREATININE: 1.08 mg/dL (ref 0.70–1.18)
Calcium: 9.9 mg/dL (ref 8.6–10.3)
GLOBULIN: 2.2 g/dL (ref 1.9–3.7)
GLUCOSE: 108 mg/dL — AB (ref 65–99)
POTASSIUM: 4.5 mmol/L (ref 3.5–5.3)
Sodium: 139 mmol/L (ref 135–146)
TOTAL PROTEIN: 6.5 g/dL (ref 6.1–8.1)
Total Bilirubin: 0.7 mg/dL (ref 0.2–1.2)

## 2017-12-17 LAB — CBC WITH DIFFERENTIAL/PLATELET
BASOS PCT: 1.9 %
Basophils Absolute: 108 cells/uL (ref 0–200)
Eosinophils Absolute: 542 cells/uL — ABNORMAL HIGH (ref 15–500)
Eosinophils Relative: 9.5 %
HCT: 42.3 % (ref 38.5–50.0)
Hemoglobin: 14.8 g/dL (ref 13.2–17.1)
Lymphs Abs: 1488 cells/uL (ref 850–3900)
MCH: 32.8 pg (ref 27.0–33.0)
MCHC: 35 g/dL (ref 32.0–36.0)
MCV: 93.8 fL (ref 80.0–100.0)
MONOS PCT: 10.1 %
MPV: 9.7 fL (ref 7.5–12.5)
NEUTROS ABS: 2987 {cells}/uL (ref 1500–7800)
Neutrophils Relative %: 52.4 %
PLATELETS: 228 10*3/uL (ref 140–400)
RBC: 4.51 10*6/uL (ref 4.20–5.80)
RDW: 12.6 % (ref 11.0–15.0)
TOTAL LYMPHOCYTE: 26.1 %
WBC mixed population: 576 cells/uL (ref 200–950)
WBC: 5.7 10*3/uL (ref 3.8–10.8)

## 2017-12-17 LAB — LIPID PANEL
CHOL/HDL RATIO: 2.8 (calc) (ref <5.0)
Cholesterol: 137 mg/dL (ref <200)
HDL: 49 mg/dL (ref >40)
LDL Cholesterol (Calc): 72 mg/dL (calc)
NON-HDL CHOLESTEROL (CALC): 88 mg/dL (ref <130)
Triglycerides: 81 mg/dL (ref <150)

## 2017-12-17 LAB — VITAMIN D 25 HYDROXY (VIT D DEFICIENCY, FRACTURES): Vit D, 25-Hydroxy: 45 ng/mL (ref 30–100)

## 2017-12-17 LAB — HEMOGLOBIN A1C
Hgb A1c MFr Bld: 5.5 % of total Hgb (ref <5.7)
MEAN PLASMA GLUCOSE: 111 (calc)
eAG (mmol/L): 6.2 (calc)

## 2017-12-18 ENCOUNTER — Encounter: Payer: Self-pay | Admitting: *Deleted

## 2017-12-31 ENCOUNTER — Encounter: Payer: Self-pay | Admitting: Family Medicine

## 2017-12-31 ENCOUNTER — Ambulatory Visit (HOSPITAL_COMMUNITY)
Admission: RE | Admit: 2017-12-31 | Discharge: 2017-12-31 | Disposition: A | Discharge status: Home / Self Care | Payer: Medicare Other | Source: Ambulatory Visit | Attending: Family Medicine | Admitting: Family Medicine

## 2017-12-31 DIAGNOSIS — M47816 Spondylosis without myelopathy or radiculopathy, lumbar region: Secondary | ICD-10-CM | POA: Insufficient documentation

## 2017-12-31 DIAGNOSIS — M85861 Other specified disorders of bone density and structure, right lower leg: Secondary | ICD-10-CM | POA: Insufficient documentation

## 2017-12-31 DIAGNOSIS — M1711 Unilateral primary osteoarthritis, right knee: Secondary | ICD-10-CM | POA: Diagnosis not present

## 2017-12-31 DIAGNOSIS — M8580 Other specified disorders of bone density and structure, unspecified site: Secondary | ICD-10-CM | POA: Insufficient documentation

## 2017-12-31 DIAGNOSIS — M85852 Other specified disorders of bone density and structure, left thigh: Secondary | ICD-10-CM | POA: Diagnosis not present

## 2017-12-31 DIAGNOSIS — M85862 Other specified disorders of bone density and structure, left lower leg: Secondary | ICD-10-CM | POA: Insufficient documentation

## 2018-01-19 DIAGNOSIS — M1712 Unilateral primary osteoarthritis, left knee: Secondary | ICD-10-CM | POA: Diagnosis not present

## 2018-01-19 DIAGNOSIS — M1612 Unilateral primary osteoarthritis, left hip: Secondary | ICD-10-CM | POA: Diagnosis not present

## 2018-01-26 DIAGNOSIS — M1712 Unilateral primary osteoarthritis, left knee: Secondary | ICD-10-CM | POA: Diagnosis not present

## 2018-02-01 IMAGING — US US ASPIRATION
1 series · 13 of 13 positions shown · non-contrast
Comparison: none

INDICATION: Right knee Baker's cyst

EXAM:
US ASPIRATION
MEDICATIONS:
The patient is currently admitted to the hospital and receiving
intravenous antibiotics. The antibiotics were administered within an
appropriate time frame prior to the initiation of the procedure.
ANESTHESIA/SEDATION:
None
COMPLICATIONS:
None immediate.
TECHNIQUE: Informed written consent was obtained from the patient after a
thorough discussion of the procedural risks, benefits and
alternatives. All questions were addressed. Maximal Sterile Barrier
Technique was utilized including caps, mask, sterile gowns, sterile
gloves, sterile drape, hand hygiene and skin antiseptic. A timeout
was performed prior to the initiation of the procedure.

[Series 1: us aspiration · 0.08mm/px · 13 acquisitions, 13 frames shown]
[im 1/13]
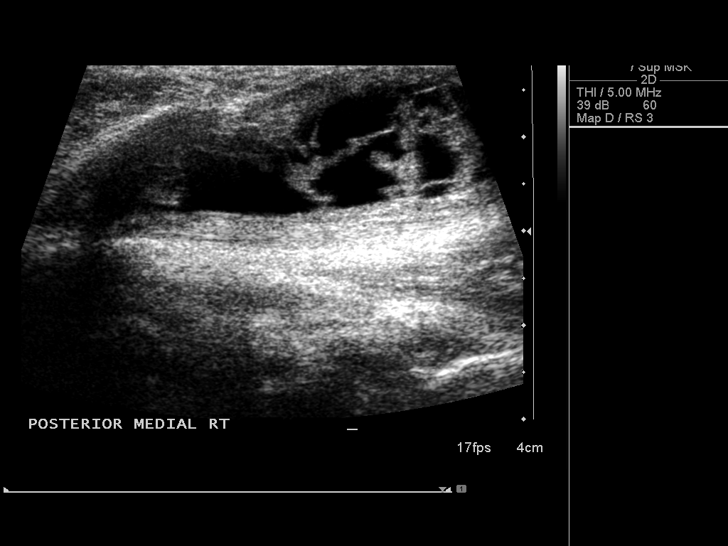
[im 2/13]
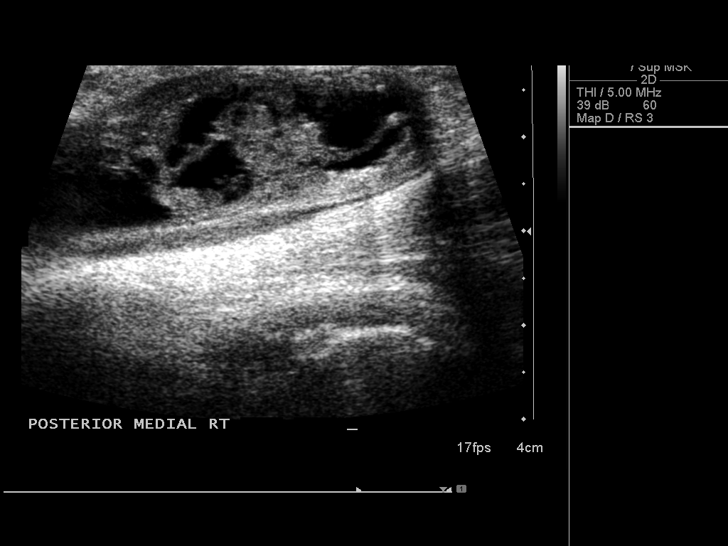
[im 3/13]
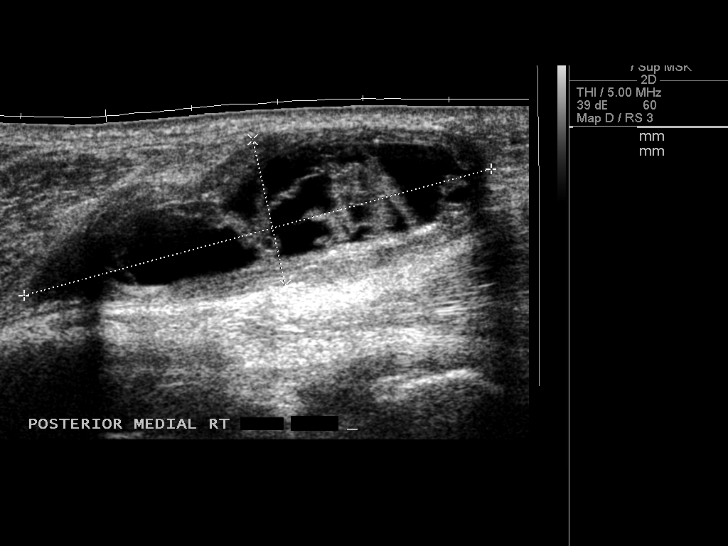
[im 4/13]
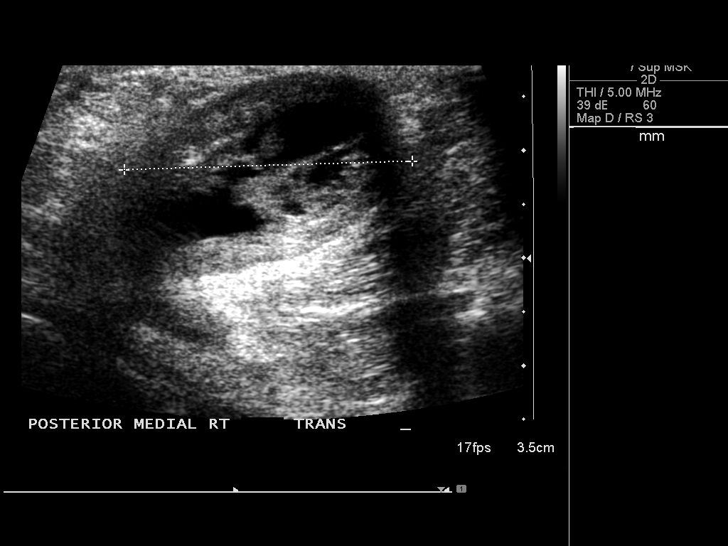
[im 5/13]
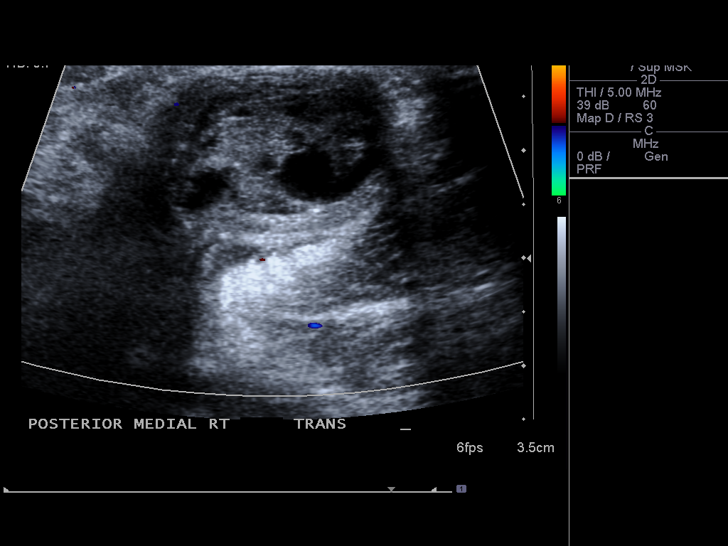
[im 6/13]
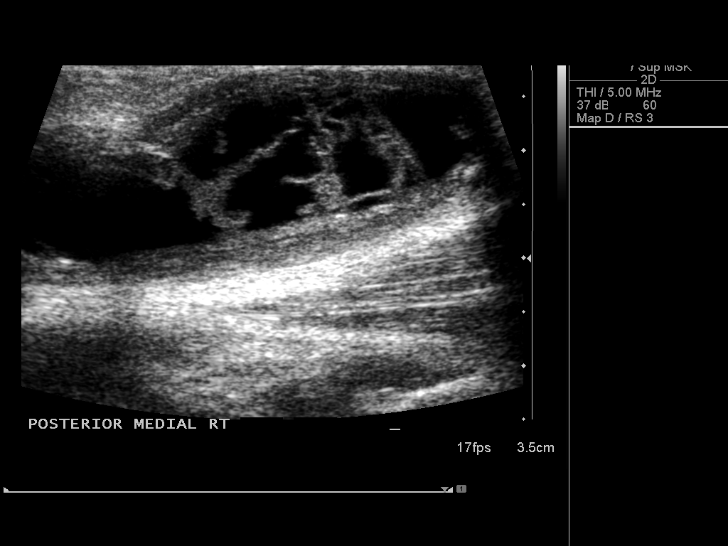
[im 7/13]
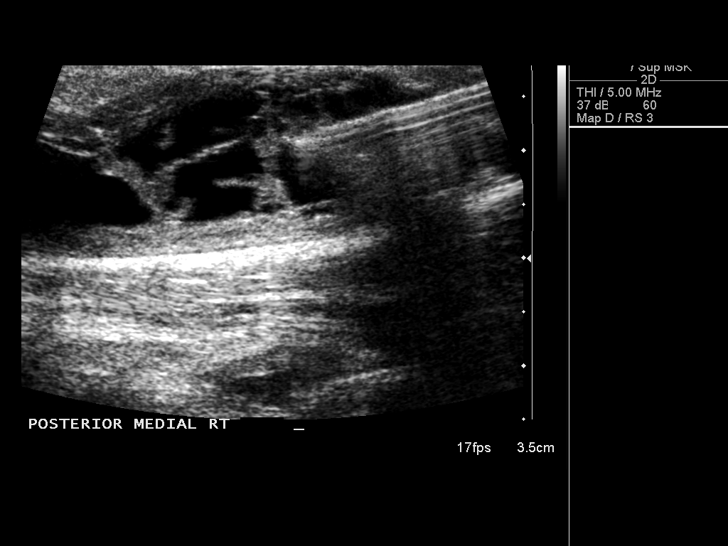
[im 8/13]
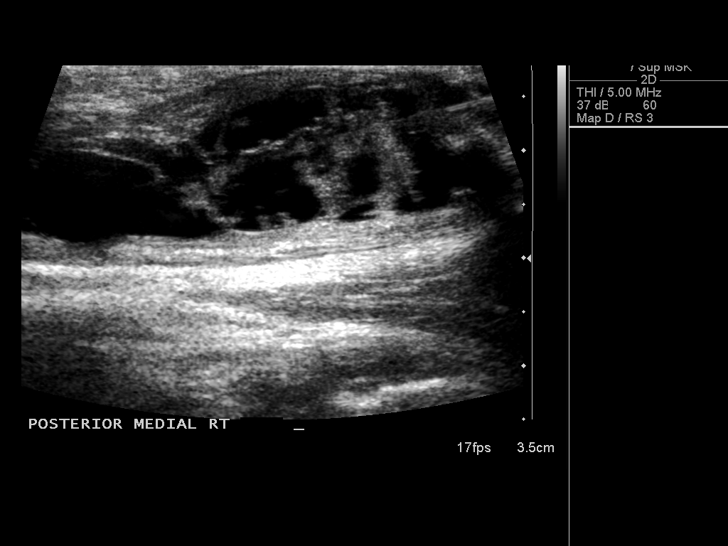
[im 9/13]
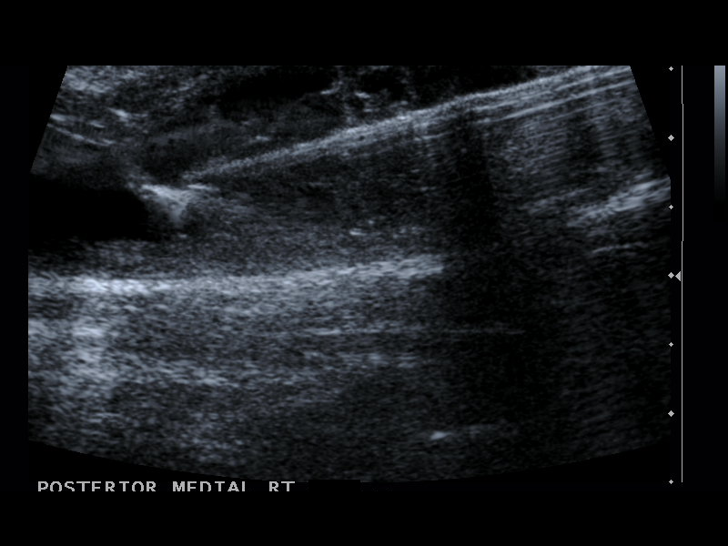
[im 10/13]
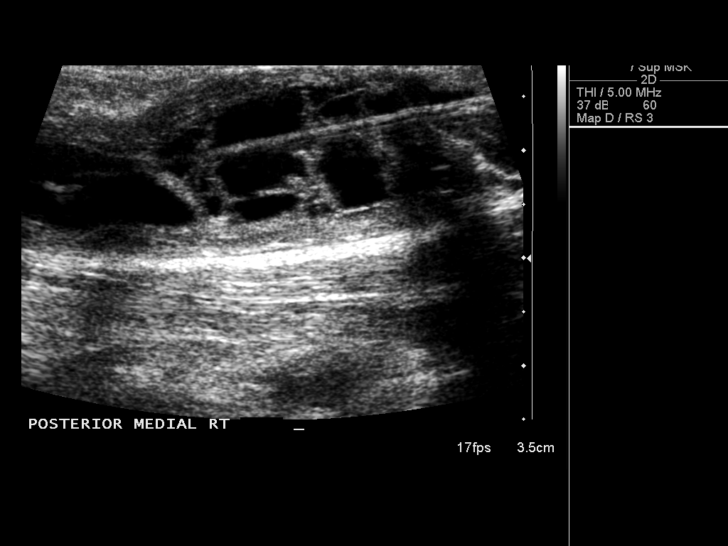
[im 11/13]
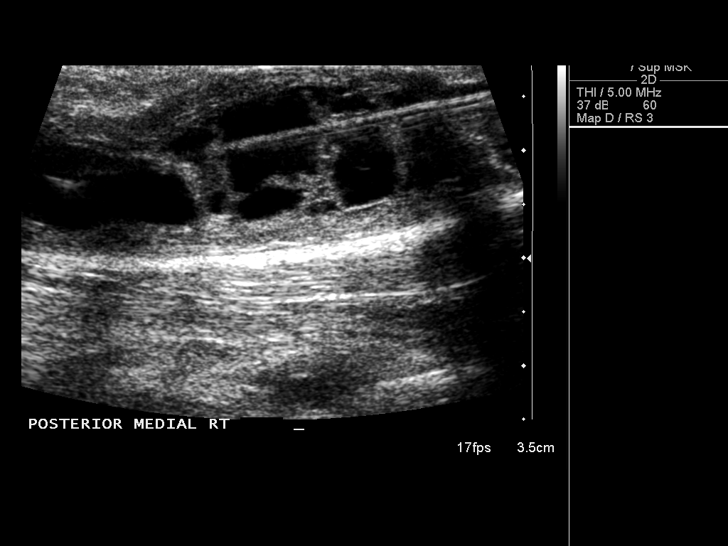
[im 12/13]
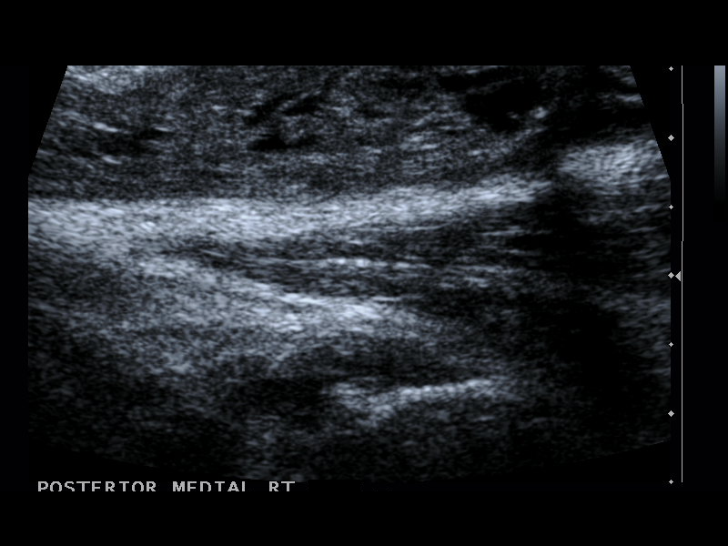
[im 13/13]
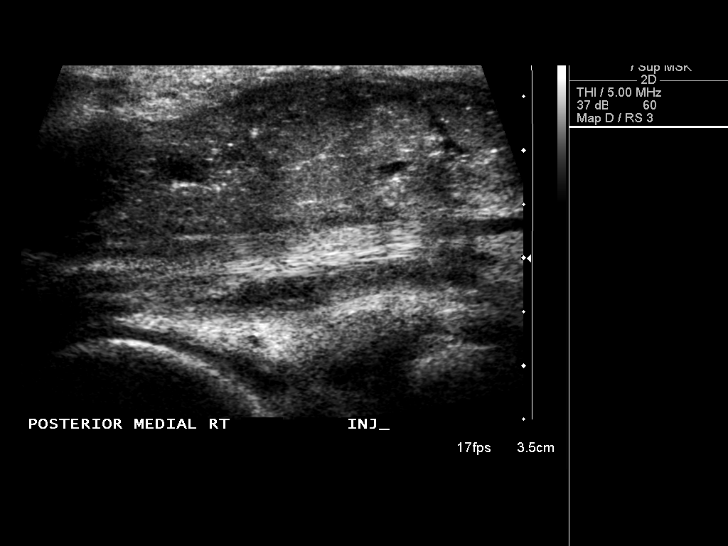

[13 of 13 positions shown; findings below may reference images not displayed]

Survey ultrasound of the popliteal fossa was performed and a medial
posterior elongated fluid collection was identified. Overlying skin
prepped with Betadine, draped in usual sterile fashion, infiltrated
locally with 1% lidocaine. An 18-gauge needle was advanced into the
collection under ultrasound guidance. Approximately 5 cc of clear
straw-colored fluid were aspirated. 80 mg Depo-Medrol were then
administered into the collection. Patient tolerated procedure and
was discharged home in good condition.
IMPRESSION: Technically successful aspiration and injection of right popliteal
cyst.

## 2018-02-03 DIAGNOSIS — M9904 Segmental and somatic dysfunction of sacral region: Secondary | ICD-10-CM | POA: Diagnosis not present

## 2018-02-03 DIAGNOSIS — S332XXA Dislocation of sacroiliac and sacrococcygeal joint, initial encounter: Secondary | ICD-10-CM | POA: Diagnosis not present

## 2018-02-06 DIAGNOSIS — M9904 Segmental and somatic dysfunction of sacral region: Secondary | ICD-10-CM | POA: Diagnosis not present

## 2018-02-06 DIAGNOSIS — S332XXA Dislocation of sacroiliac and sacrococcygeal joint, initial encounter: Secondary | ICD-10-CM | POA: Diagnosis not present

## 2018-02-09 DIAGNOSIS — M1712 Unilateral primary osteoarthritis, left knee: Secondary | ICD-10-CM | POA: Diagnosis not present

## 2018-02-10 DIAGNOSIS — M9904 Segmental and somatic dysfunction of sacral region: Secondary | ICD-10-CM | POA: Diagnosis not present

## 2018-02-10 DIAGNOSIS — S332XXA Dislocation of sacroiliac and sacrococcygeal joint, initial encounter: Secondary | ICD-10-CM | POA: Diagnosis not present

## 2018-02-19 DIAGNOSIS — M9904 Segmental and somatic dysfunction of sacral region: Secondary | ICD-10-CM | POA: Diagnosis not present

## 2018-02-19 DIAGNOSIS — S332XXA Dislocation of sacroiliac and sacrococcygeal joint, initial encounter: Secondary | ICD-10-CM | POA: Diagnosis not present

## 2018-02-26 DIAGNOSIS — C8200 Follicular lymphoma grade I, unspecified site: Secondary | ICD-10-CM | POA: Diagnosis not present

## 2018-02-26 DIAGNOSIS — Z6825 Body mass index (BMI) 25.0-25.9, adult: Secondary | ICD-10-CM | POA: Diagnosis not present

## 2018-03-05 ENCOUNTER — Other Ambulatory Visit: Payer: Self-pay | Admitting: *Deleted

## 2018-03-05 DIAGNOSIS — S332XXA Dislocation of sacroiliac and sacrococcygeal joint, initial encounter: Secondary | ICD-10-CM | POA: Diagnosis not present

## 2018-03-05 DIAGNOSIS — M9904 Segmental and somatic dysfunction of sacral region: Secondary | ICD-10-CM | POA: Diagnosis not present

## 2018-03-05 MED ORDER — LISINOPRIL 10 MG PO TABS: 10.0000 mg | ORAL_TABLET | Freq: Every evening | ORAL | 3 refills | Status: DC

## 2018-04-03 DIAGNOSIS — I350 Nonrheumatic aortic (valve) stenosis: Secondary | ICD-10-CM | POA: Diagnosis not present

## 2018-04-03 DIAGNOSIS — E785 Hyperlipidemia, unspecified: Secondary | ICD-10-CM | POA: Diagnosis not present

## 2018-04-03 DIAGNOSIS — Z96651 Presence of right artificial knee joint: Secondary | ICD-10-CM | POA: Diagnosis not present

## 2018-04-03 DIAGNOSIS — E611 Iron deficiency: Secondary | ICD-10-CM | POA: Diagnosis not present

## 2018-04-03 DIAGNOSIS — L409 Psoriasis, unspecified: Secondary | ICD-10-CM | POA: Diagnosis not present

## 2018-04-03 DIAGNOSIS — M1712 Unilateral primary osteoarthritis, left knee: Secondary | ICD-10-CM | POA: Diagnosis not present

## 2018-04-03 DIAGNOSIS — B029 Zoster without complications: Secondary | ICD-10-CM | POA: Diagnosis not present

## 2018-04-03 DIAGNOSIS — Z8572 Personal history of non-Hodgkin lymphomas: Secondary | ICD-10-CM | POA: Diagnosis not present

## 2018-04-03 DIAGNOSIS — C8200 Follicular lymphoma grade I, unspecified site: Secondary | ICD-10-CM | POA: Diagnosis not present

## 2018-04-03 DIAGNOSIS — M25562 Pain in left knee: Secondary | ICD-10-CM | POA: Diagnosis not present

## 2018-04-03 DIAGNOSIS — N138 Other obstructive and reflux uropathy: Secondary | ICD-10-CM | POA: Diagnosis not present

## 2018-04-03 DIAGNOSIS — H9193 Unspecified hearing loss, bilateral: Secondary | ICD-10-CM | POA: Diagnosis not present

## 2018-04-03 DIAGNOSIS — R5381 Other malaise: Secondary | ICD-10-CM | POA: Diagnosis not present

## 2018-04-03 DIAGNOSIS — N401 Enlarged prostate with lower urinary tract symptoms: Secondary | ICD-10-CM | POA: Diagnosis not present

## 2018-04-03 DIAGNOSIS — I10 Essential (primary) hypertension: Secondary | ICD-10-CM | POA: Diagnosis not present

## 2018-04-03 DIAGNOSIS — M109 Gout, unspecified: Secondary | ICD-10-CM | POA: Diagnosis not present

## 2018-04-03 DIAGNOSIS — S83207S Unspecified tear of unspecified meniscus, current injury, left knee, sequela: Secondary | ICD-10-CM | POA: Diagnosis not present

## 2018-04-28 DIAGNOSIS — M1712 Unilateral primary osteoarthritis, left knee: Secondary | ICD-10-CM | POA: Diagnosis not present

## 2018-04-30 DIAGNOSIS — I350 Nonrheumatic aortic (valve) stenosis: Secondary | ICD-10-CM | POA: Diagnosis not present

## 2018-04-30 DIAGNOSIS — R9431 Abnormal electrocardiogram [ECG] [EKG]: Secondary | ICD-10-CM | POA: Diagnosis not present

## 2018-04-30 DIAGNOSIS — I10 Essential (primary) hypertension: Secondary | ICD-10-CM | POA: Diagnosis not present

## 2018-05-18 DIAGNOSIS — L720 Epidermal cyst: Secondary | ICD-10-CM | POA: Diagnosis not present

## 2018-05-18 DIAGNOSIS — D1801 Hemangioma of skin and subcutaneous tissue: Secondary | ICD-10-CM | POA: Diagnosis not present

## 2018-05-18 DIAGNOSIS — L814 Other melanin hyperpigmentation: Secondary | ICD-10-CM | POA: Diagnosis not present

## 2018-05-18 DIAGNOSIS — L570 Actinic keratosis: Secondary | ICD-10-CM | POA: Diagnosis not present

## 2018-05-18 DIAGNOSIS — L298 Other pruritus: Secondary | ICD-10-CM | POA: Diagnosis not present

## 2018-05-18 DIAGNOSIS — Z1283 Encounter for screening for malignant neoplasm of skin: Secondary | ICD-10-CM | POA: Diagnosis not present

## 2018-06-04 DIAGNOSIS — N401 Enlarged prostate with lower urinary tract symptoms: Secondary | ICD-10-CM | POA: Diagnosis not present

## 2018-06-04 DIAGNOSIS — S83207S Unspecified tear of unspecified meniscus, current injury, left knee, sequela: Secondary | ICD-10-CM | POA: Diagnosis not present

## 2018-06-04 DIAGNOSIS — N138 Other obstructive and reflux uropathy: Secondary | ICD-10-CM | POA: Diagnosis not present

## 2018-06-04 DIAGNOSIS — M1712 Unilateral primary osteoarthritis, left knee: Secondary | ICD-10-CM | POA: Diagnosis not present

## 2018-06-04 DIAGNOSIS — I10 Essential (primary) hypertension: Secondary | ICD-10-CM | POA: Diagnosis not present

## 2018-06-04 DIAGNOSIS — H9193 Unspecified hearing loss, bilateral: Secondary | ICD-10-CM | POA: Diagnosis not present

## 2018-06-04 DIAGNOSIS — Z9889 Other specified postprocedural states: Secondary | ICD-10-CM | POA: Diagnosis not present

## 2018-06-04 DIAGNOSIS — R600 Localized edema: Secondary | ICD-10-CM | POA: Diagnosis not present

## 2018-06-04 DIAGNOSIS — Z6826 Body mass index (BMI) 26.0-26.9, adult: Secondary | ICD-10-CM | POA: Diagnosis not present

## 2018-06-04 DIAGNOSIS — M1711 Unilateral primary osteoarthritis, right knee: Secondary | ICD-10-CM | POA: Diagnosis not present

## 2018-06-04 DIAGNOSIS — M25562 Pain in left knee: Secondary | ICD-10-CM | POA: Diagnosis not present

## 2018-06-04 DIAGNOSIS — Z8572 Personal history of non-Hodgkin lymphomas: Secondary | ICD-10-CM | POA: Diagnosis not present

## 2018-06-04 DIAGNOSIS — M109 Gout, unspecified: Secondary | ICD-10-CM | POA: Diagnosis not present

## 2018-06-05 ENCOUNTER — Inpatient Hospital Stay (HOSPITAL_COMMUNITY): Admission: RE | Admit: 2018-06-05 | Payer: Medicare Other | Source: Ambulatory Visit

## 2018-06-09 DIAGNOSIS — H04203 Unspecified epiphora, bilateral lacrimal glands: Secondary | ICD-10-CM | POA: Diagnosis not present

## 2018-06-09 DIAGNOSIS — H25093 Other age-related incipient cataract, bilateral: Secondary | ICD-10-CM | POA: Diagnosis not present

## 2018-06-15 ENCOUNTER — Encounter (HOSPITAL_COMMUNITY): Admission: RE | Payer: Self-pay | Source: Ambulatory Visit

## 2018-06-15 ENCOUNTER — Inpatient Hospital Stay (HOSPITAL_COMMUNITY): Admission: RE | Admit: 2018-06-15 | Payer: Medicare Other | Source: Ambulatory Visit | Admitting: Orthopedic Surgery

## 2018-06-15 SURGERY — ARTHROPLASTY, KNEE, TOTAL
Anesthesia: Spinal | Laterality: Left

## 2018-06-23 ENCOUNTER — Encounter: Payer: Medicare Other | Admitting: Family Medicine

## 2018-06-24 ENCOUNTER — Other Ambulatory Visit: Payer: Self-pay | Admitting: Family Medicine

## 2018-09-22 ENCOUNTER — Ambulatory Visit (INDEPENDENT_AMBULATORY_CARE_PROVIDER_SITE_OTHER): Payer: Medicare Other | Admitting: Family Medicine

## 2018-09-22 ENCOUNTER — Encounter: Payer: Self-pay | Admitting: Family Medicine

## 2018-09-22 ENCOUNTER — Other Ambulatory Visit: Payer: Self-pay

## 2018-09-22 VITALS — BP 126/64 | HR 78 | Temp 97.5°F | Resp 14 | Ht 63.0 in | Wt 156.8 lb

## 2018-09-22 DIAGNOSIS — I350 Nonrheumatic aortic (valve) stenosis: Secondary | ICD-10-CM | POA: Diagnosis not present

## 2018-09-22 DIAGNOSIS — C8280 Other types of follicular lymphoma, unspecified site: Secondary | ICD-10-CM | POA: Diagnosis not present

## 2018-09-22 DIAGNOSIS — R7303 Prediabetes: Secondary | ICD-10-CM | POA: Diagnosis not present

## 2018-09-22 DIAGNOSIS — Z0000 Encounter for general adult medical examination without abnormal findings: Secondary | ICD-10-CM | POA: Diagnosis not present

## 2018-09-22 DIAGNOSIS — M8589 Other specified disorders of bone density and structure, multiple sites: Secondary | ICD-10-CM | POA: Diagnosis not present

## 2018-09-22 DIAGNOSIS — I10 Essential (primary) hypertension: Secondary | ICD-10-CM | POA: Diagnosis not present

## 2018-09-22 MED ORDER — AMLODIPINE BESYLATE 10 MG PO TABS: 10.0000 mg | ORAL_TABLET | Freq: Every day | ORAL | 3 refills | Status: DC

## 2018-09-22 MED ORDER — LISINOPRIL 10 MG PO TABS: 10.0000 mg | ORAL_TABLET | Freq: Every evening | ORAL | 3 refills | Status: DC

## 2018-09-22 MED ORDER — PRAVASTATIN SODIUM 20 MG PO TABS: 20.0000 mg | ORAL_TABLET | Freq: Every evening | ORAL | 3 refills | Status: DC

## 2018-09-22 MED ORDER — ALLOPURINOL 300 MG PO TABS: 300.0000 mg | ORAL_TABLET | Freq: Every day | ORAL | 3 refills | Status: DC

## 2018-09-22 MED ORDER — TAMSULOSIN HCL 0.4 MG PO CAPS: 0.4000 mg | ORAL_CAPSULE | Freq: Every evening | ORAL | 3 refills | Status: DC

## 2018-09-22 NOTE — Patient Instructions (Signed)
F/U 6 months  We will call with lab results  

## 2018-09-22 NOTE — Progress Notes (Signed)
Subjective:   Patient presents for Medicare Annual/Subsequent preventive examination.    Pt here for wellness exam  Medications reviewed   No new concerns  Decided since knee pain improved, hold off on knee replacement at this time   He had 1 visit with Dr. Joella Prince at [place] at [facility] but will continue follow up here for now    Review Past Medical/Family/Social: per EMR    Risk Factors  Current exercise habits: walks/planet fitness Dietary issues discussed:  No major concerns   Cardiac risk factors: HTN, aortic stenosis    Depression Screen  (Note: if answer to either of the following is "Yes", a more complete depression screening is indicated)  Over the past two weeks, have you felt down, depressed or hopeless? No Over the past two weeks, have you felt little interest or pleasure in doing things? No Have you lost interest or pleasure in daily life? No Do you often feel hopeless? No Do you cry easily over simple problems? No   Activities of Daily Living  In your present state of health, do you have any difficulty performing the following activities?:  Driving? No  Managing money? No  Feeding yourself? No  Getting from bed to chair? No  Climbing a flight of stairs? No  Preparing food and eating?: No  Bathing or showering? No  Getting dressed: No  Getting to the toilet? No  Using the toilet:No  Moving around from place to place: No  In the past year have you fallen or had a near fall?:No  Are you sexually active? No  Do you have more than one partner? No   Hearing Difficulties: Yes, hearing aides  Do you often ask people to speak up or repeat themselves? Yes  Do you experience ringing or noises in your ears? No Do you have difficulty understanding soft or whispered voices? Yes  Do you feel that you have a problem with memory? No Do you often misplace items? No  Do you feel safe at home? Yes  Cognitive Testing  Alert? Yes Normal Appearance?Yes  Oriented to person?  Yes Place? Yes  Time? Yes  Recall of three objects? Yes  Can perform simple calculations? Yes  Displays appropriate judgment?Yes  Can read the correct time from a watch face?Yes   List the Names of Other Physician/Practitioners you currently use:   Cardiology- Dr. Rosana Hoes  Oncology- [place]  [facility]  Ortho- Raliegh Ip   Screening Tests / Date Colonoscopy       Over age               Shingrix- UTD Pneumonia- UTD Influenza Vaccine  UTD Tetanus/tdap UTD  ROS: GEN- denies fatigue, fever, weight loss,weakness, recent illness HEENT- denies eye drainage, change in vision, nasal discharge, CVS- denies chest pain, palpitations RESP- denies SOB, cough, wheeze ABD- denies N/V, change in stools, abd pain GU- denies dysuria, hematuria, dribbling, incontinence MSK- denies joint pain, muscle aches, injury Neuro- denies headache, dizziness, syncope, seizure activity  Physical: Vitals reviewed  GEN- NAD, alert and oriented x3 HEENT- PERRL, EOMI, non injected sclera, pink conjunctiva, MMM, oropharynx clear, TM clear no effusion, nares clear  Neck- Supple, no thryomegaly, no bruit CVS- RRR, 3/6 systolic murmur RESP-CTAB ABD-NABS,soft,NT,ND EXT- No edema Pulses- Radial, DP- 2+    Assessment:    Annual wellness medicare exam   Plan:    During the course of the visit the patient was educated and counseled about appropriate screening and preventive services including:  Audit C/FALL/Depression screen negative    Osteopenia- vitamin D and calcium   Non Hodkings Lymphpona in remission  HTN- well controlled, followed by cardiology for aortic stenosis    Borderline DM- check A1C  Advanced directives DONE, Full code   Immunizations UTD  He has had PSA screening done recently negative with Dr. Joella Prince at [place] review for nutrition referral? Yes ____ Not Indicated __x__  Patient Instructions (the written plan) was given to the patient.  Medicare Attestation  I  have personally reviewed:  The patient's medical and social history  Their use of alcohol, tobacco or illicit drugs  Their current medications and supplements  The patient's functional ability including ADLs,fall risks, home safety risks, cognitive, and hearing and visual impairment  Diet and physical activities  Evidence for depression or mood disorders  The patient's weight, height, BMI, and visual acuity have been recorded in the chart. I have made referrals, counseling, and provided education to the patient based on review of the above and I have provided the patient with a written personalized care plan for preventive services.

## 2018-09-22 NOTE — Addendum Note (Signed)
Addended by: Vic Blackbird F on: 09/22/2018 08:35 PM   Modules accepted: Orders

## 2018-09-23 LAB — COMPREHENSIVE METABOLIC PANEL
AG Ratio: 2.2 (calc) (ref 1.0–2.5)
ALT: 20 U/L (ref 9–46)
AST: 17 U/L (ref 10–35)
Albumin: 4.3 g/dL (ref 3.6–5.1)
Alkaline phosphatase (APISO): 59 U/L (ref 35–144)
BUN/Creatinine Ratio: 23 (calc) — ABNORMAL HIGH (ref 6–22)
BUN: 26 mg/dL — AB (ref 7–25)
CO2: 24 mmol/L (ref 20–32)
Calcium: 9.5 mg/dL (ref 8.6–10.3)
Chloride: 107 mmol/L (ref 98–110)
Creat: 1.14 mg/dL (ref 0.70–1.18)
Globulin: 2 g/dL (calc) (ref 1.9–3.7)
Glucose, Bld: 108 mg/dL — ABNORMAL HIGH (ref 65–99)
Potassium: 4.2 mmol/L (ref 3.5–5.3)
Sodium: 139 mmol/L (ref 135–146)
Total Bilirubin: 0.5 mg/dL (ref 0.2–1.2)
Total Protein: 6.3 g/dL (ref 6.1–8.1)

## 2018-09-23 LAB — CBC WITH DIFFERENTIAL/PLATELET
Absolute Monocytes: 757 cells/uL (ref 200–950)
Basophils Absolute: 131 cells/uL (ref 0–200)
Basophils Relative: 1.5 %
Eosinophils Absolute: 409 cells/uL (ref 15–500)
Eosinophils Relative: 4.7 %
HCT: 42.2 % (ref 38.5–50.0)
Hemoglobin: 14.7 g/dL (ref 13.2–17.1)
Lymphs Abs: 1479 cells/uL (ref 850–3900)
MCH: 33.5 pg — ABNORMAL HIGH (ref 27.0–33.0)
MCHC: 34.8 g/dL (ref 32.0–36.0)
MCV: 96.1 fL (ref 80.0–100.0)
MPV: 10 fL (ref 7.5–12.5)
Monocytes Relative: 8.7 %
Neutro Abs: 5925 cells/uL (ref 1500–7800)
Neutrophils Relative %: 68.1 %
Platelets: 223 10*3/uL (ref 140–400)
RBC: 4.39 10*6/uL (ref 4.20–5.80)
RDW: 13.1 % (ref 11.0–15.0)
Total Lymphocyte: 17 %
WBC: 8.7 10*3/uL (ref 3.8–10.8)

## 2018-09-23 LAB — LIPID PANEL
Cholesterol: 140 mg/dL (ref <200)
HDL: 47 mg/dL (ref >=40)
LDL Cholesterol (Calc): 76 mg/dL (calc)
NON-HDL CHOLESTEROL (CALC): 93 mg/dL (ref <130)
Total CHOL/HDL Ratio: 3 (calc) (ref <5.0)
Triglycerides: 87 mg/dL (ref <150)

## 2018-09-23 LAB — HEMOGLOBIN A1C
Hgb A1c MFr Bld: 5.3 % of total Hgb (ref <5.7)
Mean Plasma Glucose: 105 (calc)
eAG (mmol/L): 5.8 (calc)

## 2018-09-24 ENCOUNTER — Encounter: Payer: Self-pay | Admitting: *Deleted

## 2018-11-23 DIAGNOSIS — M1712 Unilateral primary osteoarthritis, left knee: Secondary | ICD-10-CM | POA: Diagnosis not present

## 2018-11-30 DIAGNOSIS — M1712 Unilateral primary osteoarthritis, left knee: Secondary | ICD-10-CM | POA: Diagnosis not present

## 2018-12-07 DIAGNOSIS — M1712 Unilateral primary osteoarthritis, left knee: Secondary | ICD-10-CM | POA: Diagnosis not present

## 2018-12-14 DIAGNOSIS — M1712 Unilateral primary osteoarthritis, left knee: Secondary | ICD-10-CM | POA: Diagnosis not present

## 2019-02-08 DIAGNOSIS — M546 Pain in thoracic spine: Secondary | ICD-10-CM | POA: Diagnosis not present

## 2019-02-08 DIAGNOSIS — M47816 Spondylosis without myelopathy or radiculopathy, lumbar region: Secondary | ICD-10-CM | POA: Diagnosis not present

## 2019-03-10 DIAGNOSIS — I10 Essential (primary) hypertension: Secondary | ICD-10-CM | POA: Diagnosis not present

## 2019-03-10 DIAGNOSIS — M1711 Unilateral primary osteoarthritis, right knee: Secondary | ICD-10-CM | POA: Diagnosis not present

## 2019-03-10 DIAGNOSIS — M109 Gout, unspecified: Secondary | ICD-10-CM | POA: Diagnosis not present

## 2019-03-10 DIAGNOSIS — Z6825 Body mass index (BMI) 25.0-25.9, adult: Secondary | ICD-10-CM | POA: Diagnosis not present

## 2019-03-10 DIAGNOSIS — Z8572 Personal history of non-Hodgkin lymphomas: Secondary | ICD-10-CM | POA: Diagnosis not present

## 2019-03-17 DIAGNOSIS — Z6825 Body mass index (BMI) 25.0-25.9, adult: Secondary | ICD-10-CM | POA: Diagnosis not present

## 2019-03-17 DIAGNOSIS — C8200 Follicular lymphoma grade I, unspecified site: Secondary | ICD-10-CM | POA: Diagnosis not present

## 2019-03-17 DIAGNOSIS — D7589 Other specified diseases of blood and blood-forming organs: Secondary | ICD-10-CM | POA: Diagnosis not present

## 2019-03-24 ENCOUNTER — Other Ambulatory Visit: Payer: Self-pay

## 2019-03-24 ENCOUNTER — Ambulatory Visit (INDEPENDENT_AMBULATORY_CARE_PROVIDER_SITE_OTHER): Payer: Medicare Other | Admitting: Family Medicine

## 2019-03-24 ENCOUNTER — Encounter: Payer: Self-pay | Admitting: Family Medicine

## 2019-03-24 VITALS — BP 118/70 | HR 80 | Temp 98.2°F | Resp 14 | Ht 63.0 in | Wt 149.0 lb

## 2019-03-24 DIAGNOSIS — C8280 Other types of follicular lymphoma, unspecified site: Secondary | ICD-10-CM

## 2019-03-24 DIAGNOSIS — E785 Hyperlipidemia, unspecified: Secondary | ICD-10-CM | POA: Diagnosis not present

## 2019-03-24 DIAGNOSIS — M8589 Other specified disorders of bone density and structure, multiple sites: Secondary | ICD-10-CM | POA: Diagnosis not present

## 2019-03-24 DIAGNOSIS — Z23 Encounter for immunization: Secondary | ICD-10-CM | POA: Diagnosis not present

## 2019-03-24 DIAGNOSIS — I10 Essential (primary) hypertension: Secondary | ICD-10-CM | POA: Diagnosis not present

## 2019-03-24 MED ORDER — AMLODIPINE BESYLATE 10 MG PO TABS: 5.0000 mg | ORAL_TABLET | Freq: Every day | ORAL | 3 refills | Status: DC

## 2019-03-24 NOTE — Patient Instructions (Addendum)
F/U lab visit  F/U 6 months for Physical

## 2019-03-24 NOTE — Progress Notes (Addendum)
   Subjective:    Patient ID: Lucas Pugh, male    DOB: [birth year], 78 y.o.   MRN: [id number]  Patient presents for Follow-up (is fasting)   Pt here to f/u chronic medical problems  Followed by multiple specialist   HTN- he had been taking BP at home noticed it was running high so he upped his norvasc back to 10mg  but was not feeling as well, when he was seen by Geritrician- Dr. Joella Prince at [facility], BP was actually on low end, brought Bp machine in today to review , they also advised to reduce norvasc down    Seen by Dr. Layne Benton for back pain, told arthritis after xray, he did twist his back last week this caused spasam, using brace in the morning     Hyperlipidemia due for fasting labs    [facility]     Reviewed recent visit to oncology- lymphoma still in remission  REVIEWED recent labs  Review Of Systems:  GEN- denies fatigue, fever, weight loss,weakness, recent illness HEENT- denies eye drainage, change in vision, nasal discharge, CVS- denies chest pain, palpitations RESP- denies SOB, cough, wheeze ABD- denies N/V, change in stools, abd pain GU- denies dysuria, hematuria, dribbling, incontinence MSK- +joint pain, muscle aches, injury Neuro- denies headache, dizziness, syncope, seizure activity       Objective:    BP 118/70   Pulse 80   Temp 98.2 F (36.8 C) (Oral)   Resp 14   Ht 5\' 3"  (1.6 m)   Wt 149 lb (67.6 kg)   SpO2 96%   BMI 26.39 kg/m  GEN- NAD, alert and oriented x3 HEENT- PERRL, EOMI, non injected sclera, pink conjunctiva, MMM, oropharynx clear Neck- Supple, no thyromegaly CVS- RRR, systolic 3/6  murmur RESP-CTAB ABD-NABS,soft,NT,ND EXT- No edema Pulses- Radial, DP- 2+        Assessment & Plan:      Problem List Items Addressed This Visit      Unprioritized   Hyperlipidemia    Return for fasting labs      Relevant Medications   amLODipine (NORVASC) 10 MG tablet   Hypertension - Primary    Pt to get new BP cuff Reduce norvasc back to 5mg   once a day       Relevant Medications   amLODipine (NORVASC) 10 MG tablet   Non Hodgkin's lymphoma ([place])    In remission followed by oncology      Osteopenia    Taking calcium and vitamin D        Other Visit Diagnoses    Need for immunization against influenza       Relevant Orders   Flu Vaccine QUAD High Dose(Fluad) (Completed)      Note: This dictation was prepared with Dragon dictation along with smaller phrase technology. Any transcriptional errors that result from this process are unintentional.

## 2019-03-25 ENCOUNTER — Encounter: Payer: Self-pay | Admitting: Family Medicine

## 2019-03-25 NOTE — Addendum Note (Signed)
Addended by: Vic Blackbird F on: 03/25/2019 12:24 PM   Modules accepted: Orders

## 2019-03-25 NOTE — Assessment & Plan Note (Signed)
Return for fasting labs.

## 2019-03-25 NOTE — Assessment & Plan Note (Signed)
In remission followed by oncology

## 2019-03-25 NOTE — Assessment & Plan Note (Signed)
Taking calcium and vitamin D

## 2019-03-25 NOTE — Assessment & Plan Note (Signed)
Pt to get new BP cuff Reduce norvasc back to 5mg  once a day

## 2019-03-30 ENCOUNTER — Other Ambulatory Visit: Payer: Medicare Other

## 2019-03-30 ENCOUNTER — Other Ambulatory Visit: Payer: Self-pay

## 2019-03-30 DIAGNOSIS — E785 Hyperlipidemia, unspecified: Secondary | ICD-10-CM | POA: Diagnosis not present

## 2019-03-31 LAB — LIPID PANEL
Cholesterol: 130 mg/dL (ref <200)
HDL: 44 mg/dL (ref >=40)
LDL Cholesterol (Calc): 69 mg/dL (calc)
Non-HDL Cholesterol (Calc): 86 mg/dL (calc) (ref <130)
Total CHOL/HDL Ratio: 3 (calc) (ref <5.0)
Triglycerides: 91 mg/dL (ref <150)

## 2019-04-15 DIAGNOSIS — I080 Rheumatic disorders of both mitral and aortic valves: Secondary | ICD-10-CM | POA: Diagnosis not present

## 2019-04-15 DIAGNOSIS — I350 Nonrheumatic aortic (valve) stenosis: Secondary | ICD-10-CM | POA: Diagnosis not present

## 2019-04-15 DIAGNOSIS — R931 Abnormal findings on diagnostic imaging of heart and coronary circulation: Secondary | ICD-10-CM | POA: Diagnosis not present

## 2019-04-28 DIAGNOSIS — D1801 Hemangioma of skin and subcutaneous tissue: Secondary | ICD-10-CM | POA: Diagnosis not present

## 2019-04-28 DIAGNOSIS — Z1283 Encounter for screening for malignant neoplasm of skin: Secondary | ICD-10-CM | POA: Diagnosis not present

## 2019-04-28 DIAGNOSIS — Z6826 Body mass index (BMI) 26.0-26.9, adult: Secondary | ICD-10-CM | POA: Diagnosis not present

## 2019-04-28 DIAGNOSIS — I350 Nonrheumatic aortic (valve) stenosis: Secondary | ICD-10-CM | POA: Diagnosis not present

## 2019-04-28 DIAGNOSIS — I10 Essential (primary) hypertension: Secondary | ICD-10-CM | POA: Diagnosis not present

## 2019-04-28 DIAGNOSIS — D481 Neoplasm of uncertain behavior of connective and other soft tissue: Secondary | ICD-10-CM | POA: Diagnosis not present

## 2019-04-28 DIAGNOSIS — L728 Other follicular cysts of the skin and subcutaneous tissue: Secondary | ICD-10-CM | POA: Diagnosis not present

## 2019-04-28 DIAGNOSIS — L821 Other seborrheic keratosis: Secondary | ICD-10-CM | POA: Diagnosis not present

## 2019-05-03 ENCOUNTER — Telehealth: Payer: Self-pay | Admitting: Family Medicine

## 2019-05-03 NOTE — Telephone Encounter (Signed)
I spoke with patient.  He had evaluation by cardiothoracic surgery secondary to his aortic stenosis.  He has moderate to severe stenosis which he had a recent echocardiogram.  At this time as he is asymptomatic would not recommend any surgical intervention.  He states his surgeons at the same thing because of his age not having any symptoms they would just reevaluate him in 6 months.  He states if it does seem like his stenosis is getting worse he would like to have a couple of different opinions before undergoing surgery advised that I can set him up with consultation if need be.  Scanned documents into his chart that he has provided me with

## 2019-06-16 DIAGNOSIS — H02132 Senile ectropion of right lower eyelid: Secondary | ICD-10-CM | POA: Diagnosis not present

## 2019-06-16 DIAGNOSIS — H02135 Senile ectropion of left lower eyelid: Secondary | ICD-10-CM | POA: Diagnosis not present

## 2019-06-16 DIAGNOSIS — H25093 Other age-related incipient cataract, bilateral: Secondary | ICD-10-CM | POA: Diagnosis not present

## 2019-06-21 DIAGNOSIS — M1712 Unilateral primary osteoarthritis, left knee: Secondary | ICD-10-CM | POA: Diagnosis not present

## 2019-06-28 DIAGNOSIS — M1712 Unilateral primary osteoarthritis, left knee: Secondary | ICD-10-CM | POA: Diagnosis not present

## 2019-06-28 DIAGNOSIS — M47816 Spondylosis without myelopathy or radiculopathy, lumbar region: Secondary | ICD-10-CM | POA: Diagnosis not present

## 2019-06-28 DIAGNOSIS — M545 Low back pain: Secondary | ICD-10-CM | POA: Diagnosis not present

## 2019-07-05 DIAGNOSIS — M1712 Unilateral primary osteoarthritis, left knee: Secondary | ICD-10-CM | POA: Diagnosis not present

## 2019-07-05 DIAGNOSIS — M545 Low back pain: Secondary | ICD-10-CM | POA: Diagnosis not present

## 2019-07-05 DIAGNOSIS — M47816 Spondylosis without myelopathy or radiculopathy, lumbar region: Secondary | ICD-10-CM | POA: Diagnosis not present

## 2019-08-10 DIAGNOSIS — Z23 Encounter for immunization: Secondary | ICD-10-CM | POA: Diagnosis not present

## 2019-08-17 ENCOUNTER — Other Ambulatory Visit: Payer: Self-pay

## 2019-08-17 MED ORDER — LISINOPRIL 10 MG PO TABS: 10.0000 mg | ORAL_TABLET | Freq: Every evening | ORAL | 1 refills | Status: DC

## 2019-09-03 ENCOUNTER — Other Ambulatory Visit: Payer: Self-pay | Admitting: Family Medicine

## 2019-09-07 DIAGNOSIS — Z23 Encounter for immunization: Secondary | ICD-10-CM | POA: Diagnosis not present

## 2019-09-22 DIAGNOSIS — R9431 Abnormal electrocardiogram [ECG] [EKG]: Secondary | ICD-10-CM | POA: Diagnosis not present

## 2019-09-22 DIAGNOSIS — M109 Gout, unspecified: Secondary | ICD-10-CM | POA: Diagnosis not present

## 2019-09-22 DIAGNOSIS — I350 Nonrheumatic aortic (valve) stenosis: Secondary | ICD-10-CM | POA: Diagnosis not present

## 2019-09-22 DIAGNOSIS — E785 Hyperlipidemia, unspecified: Secondary | ICD-10-CM | POA: Diagnosis not present

## 2019-09-22 DIAGNOSIS — I10 Essential (primary) hypertension: Secondary | ICD-10-CM | POA: Diagnosis not present

## 2019-09-23 ENCOUNTER — Other Ambulatory Visit: Payer: Medicare Other

## 2019-09-27 ENCOUNTER — Encounter: Payer: Self-pay | Admitting: Family Medicine

## 2019-09-27 ENCOUNTER — Ambulatory Visit (INDEPENDENT_AMBULATORY_CARE_PROVIDER_SITE_OTHER): Payer: Medicare Other | Admitting: Family Medicine

## 2019-09-27 ENCOUNTER — Other Ambulatory Visit: Payer: Self-pay

## 2019-09-27 VITALS — BP 130/68 | HR 78 | Temp 98.3°F | Resp 14 | Ht 63.0 in | Wt 156.0 lb

## 2019-09-27 DIAGNOSIS — I10 Essential (primary) hypertension: Secondary | ICD-10-CM | POA: Diagnosis not present

## 2019-09-27 DIAGNOSIS — E559 Vitamin D deficiency, unspecified: Secondary | ICD-10-CM

## 2019-09-27 DIAGNOSIS — E785 Hyperlipidemia, unspecified: Secondary | ICD-10-CM

## 2019-09-27 DIAGNOSIS — M1A49X0 Other secondary chronic gout, multiple sites, without tophus (tophi): Secondary | ICD-10-CM | POA: Diagnosis not present

## 2019-09-27 DIAGNOSIS — C8280 Other types of follicular lymphoma, unspecified site: Secondary | ICD-10-CM | POA: Diagnosis not present

## 2019-09-27 DIAGNOSIS — I350 Nonrheumatic aortic (valve) stenosis: Secondary | ICD-10-CM

## 2019-09-27 DIAGNOSIS — Z0000 Encounter for general adult medical examination without abnormal findings: Secondary | ICD-10-CM

## 2019-09-27 MED ORDER — PRAVASTATIN SODIUM 20 MG PO TABS: 20.0000 mg | ORAL_TABLET | Freq: Every evening | ORAL | 3 refills | Status: DC

## 2019-09-27 MED ORDER — ALLOPURINOL 300 MG PO TABS: 300.0000 mg | ORAL_TABLET | Freq: Every day | ORAL | 3 refills | Status: DC

## 2019-09-27 MED ORDER — LISINOPRIL 10 MG PO TABS: 10.0000 mg | ORAL_TABLET | Freq: Every evening | ORAL | 1 refills | Status: DC

## 2019-09-27 MED ORDER — AMLODIPINE BESYLATE 10 MG PO TABS: 10.0000 mg | ORAL_TABLET | Freq: Every day | ORAL | 3 refills | Status: DC

## 2019-09-27 MED ORDER — TAMSULOSIN HCL 0.4 MG PO CAPS: 0.4000 mg | ORAL_CAPSULE | Freq: Every evening | ORAL | 3 refills | Status: DC

## 2019-09-27 NOTE — Patient Instructions (Signed)
F/U 6 months  We will call with lab results  

## 2019-09-27 NOTE — Progress Notes (Addendum)
Subjective:   Patient presents for Medicare Annual/Subsequent preventive examination.   Pt here for wellness exam  Medications reviewed   No new concerns    Continues to follow with is specialist, no recent changes in meds  HTN- last visit in August, norvasc reduced to 5mg  once a day , taking lisinopril 10mg  daily , a few times a month he does require norvasc 10mg , if bp runs [id number] systolic more than 3 days, he will take 10mg  of norvasc   Hyperlipidemia- taking pravastatin 20mg  once a day   [facility]- [facility], [facility]   Gout- continues on allopurinol daily  Aortic stenosis- seen by cardiology at [facility] about potentional valve surgery, no symptoms , decided he is not a candidate    OA knee- had gel injections by ortho which has helped    Review Past Medical/Family/Social: per EMR    Risk Factors  Current exercise habits: walks/planet fitness Dietary issues discussed:  No major concerns   Cardiac risk factors: HTN, aortic stenosis    Depression Screen  (Note: if answer to either of the following is "Yes", a more complete depression screening is indicated)  Over the past two weeks, have you felt down, depressed or hopeless? No Over the past two weeks, have you felt little interest or pleasure in doing things? No Have you lost interest or pleasure in daily life? No Do you often feel hopeless? No Do you cry easily over simple problems? No   Activities of Daily Living  In your present state of health, do you have any difficulty performing the following activities?:  Driving? No  Managing money? No  Feeding yourself? No  Getting from bed to chair? No  Climbing a flight of stairs? No  Preparing food and eating?: No  Bathing or showering? No  Getting dressed: No  Getting to the toilet? No  Using the toilet:No  Moving around from place to place: No  In the past year have you fallen or had a near fall?:No  Are you sexually active? No  Do you have more than one partner?  No   Hearing Difficulties: Yes, hearing aides  Do you often ask people to speak up or repeat themselves? Yes  Do you experience ringing or noises in your ears? No Do you have difficulty understanding soft or whispered voices? Yes  Do you feel that you have a problem with memory? No Do you often misplace items? No  Do you feel safe at home? Yes  Cognitive Testing  Alert? Yes Normal Appearance?Yes  Oriented to person? Yes Place? Yes  Time? Yes  Recall of three objects? Yes  Can perform simple calculations? Yes  Displays appropriate judgment?Yes  Can read the correct time from a watch face?Yes   List the Names of Other Physician/Practitioners you currently use:   Card[facility]- [facility] Ros[facility]es  [facility]  [facility]  Ortho- Raliegh Ip [facility]  Screening Tests / Date Colonoscopy       Over age               Shingrix- UTD Pneumonia- UTD Influenza Vaccine  UTD Tetanus/tdap UTD COVID-19 vaccine UTD  ROS: GEN- denies fatigue, fever, weight loss,weakness, recent illness HEENT- denies eye drainage, change in vision, nasal discharge, CVS- denies chest pain, palpitations RESP- denies SOB, cough, wheeze ABD- denies N/V, change in stools, abd pain GU- denies dysuria, hematuria, dribbling, incontinence MSK-+joint pain, muscle aches, injury Neuro- denies headache, dizziness, syncope, seizure activity  Physical: Vitals  reviewed  GEN- NAD, alert and oriented x3 HEENT- PERRL, EOMI, non injected sclera, pink conjunctiva, MMM, oropharynx clear, TM clear no effusion, nares clear  Neck- Supple, no thryomegaly, no bruit CVS- RRR, 3/6 systolic murmur RESP-CTAB ABD-NABS,soft,NT,ND EXT- No edema Pulses- Radial, DP- 2+    Assessment:    Annual wellness medicare exam   Plan:    During the course of the visit the patient was educated and counseled about appropriate screening and preventive services including:   Audit C/FALL/Depression screen  negative    Osteopenia- vitamin D and calcium / vitamin D def   Non Hodkings Lymphpona in remission  HTN- well controlled, followed by cardiology for aortic stenosis , no change to meds, okay to take additional 5mg  of norvasc prn   Hyperlipidemia on statin drug, cholesterol normal in Sept    History of  Borderline DM- a1c NORMAL past 2 years   Advanced directives DONE, Full code   OA- followed by Dr. Layne Benton orthopedics   Immunizations UTD  PSA followed by [facility]   Labs to be done   Diet review for nutrition referral? Yes ____ Not Indicated __x__  Patient Instructions (the written plan) was given to the patient.  Medicare Attestation  I have personally reviewed:  The patient's medical and social history  Their use of alcohol, tobacco or illicit drugs  Their current medications and supplements  The patient's functional ability including ADLs,fall risks, home safety risks, cognitive, and hearing and visual impairment  Diet and physical activities  Evidence for depression or mood disorders  The patient's weight, height, BMI, and visual acuity have been recorded in the chart. I have made referrals, counseling, and provided education to the patient based on review of the above and I have provided the patient with a written personalized care plan for preventive services.

## 2019-09-28 DIAGNOSIS — I350 Nonrheumatic aortic (valve) stenosis: Secondary | ICD-10-CM | POA: Diagnosis not present

## 2019-09-28 LAB — CBC WITH DIFFERENTIAL/PLATELET
Absolute Monocytes: 649 cells/uL (ref 200–950)
Basophils Absolute: 107 cells/uL (ref 0–200)
Basophils Relative: 1.7 %
Eosinophils Absolute: 428 cells/uL (ref 15–500)
Eosinophils Relative: 6.8 %
HCT: 43.7 % (ref 38.5–50.0)
Hemoglobin: 15.2 g/dL (ref 13.2–17.1)
Lymphs Abs: 1336 cells/uL (ref 850–3900)
MCH: 33.2 pg — ABNORMAL HIGH (ref 27.0–33.0)
MCHC: 34.8 g/dL (ref 32.0–36.0)
MCV: 95.4 fL (ref 80.0–100.0)
MPV: 9.5 fL (ref 7.5–12.5)
Monocytes Relative: 10.3 %
Neutro Abs: 3780 cells/uL (ref 1500–7800)
Neutrophils Relative %: 60 %
Platelets: 200 10*3/uL (ref 140–400)
RBC: 4.58 10*6/uL (ref 4.20–5.80)
RDW: 12.5 % (ref 11.0–15.0)
Total Lymphocyte: 21.2 %
WBC: 6.3 10*3/uL (ref 3.8–10.8)

## 2019-09-28 LAB — COMPREHENSIVE METABOLIC PANEL
AG Ratio: 2.3 (calc) (ref 1.0–2.5)
ALT: 17 U/L (ref 9–46)
AST: 16 U/L (ref 10–35)
Albumin: 4.3 g/dL (ref 3.6–5.1)
Alkaline phosphatase (APISO): 53 U/L (ref 35–144)
BUN: 19 mg/dL (ref 7–25)
CO2: 23 mmol/L (ref 20–32)
Calcium: 10 mg/dL (ref 8.6–10.3)
Chloride: 103 mmol/L (ref 98–110)
Creat: 0.92 mg/dL (ref 0.70–1.18)
Globulin: 1.9 g/dL (calc) (ref 1.9–3.7)
Glucose, Bld: 114 mg/dL — ABNORMAL HIGH (ref 65–99)
Potassium: 4.1 mmol/L (ref 3.5–5.3)
Sodium: 136 mmol/L (ref 135–146)
Total Bilirubin: 0.8 mg/dL (ref 0.2–1.2)
Total Protein: 6.2 g/dL (ref 6.1–8.1)

## 2019-09-28 LAB — LIPID PANEL
Cholesterol: 137 mg/dL (ref <200)
HDL: 47 mg/dL (ref >=40)
LDL Cholesterol (Calc): 71 mg/dL (calc)
Non-HDL Cholesterol (Calc): 90 mg/dL (calc) (ref <130)
Total CHOL/HDL Ratio: 2.9 (calc) (ref <5.0)
Triglycerides: 104 mg/dL (ref <150)

## 2019-09-28 LAB — VITAMIN D 25 HYDROXY (VIT D DEFICIENCY, FRACTURES): Vit D, 25-Hydroxy: 50 ng/mL (ref 30–100)

## 2019-09-29 ENCOUNTER — Encounter: Payer: Self-pay | Admitting: *Deleted

## 2019-10-26 DIAGNOSIS — H02132 Senile ectropion of right lower eyelid: Secondary | ICD-10-CM | POA: Diagnosis not present

## 2019-10-26 DIAGNOSIS — H57813 Brow ptosis, bilateral: Secondary | ICD-10-CM | POA: Diagnosis not present

## 2019-10-26 DIAGNOSIS — H02413 Mechanical ptosis of bilateral eyelids: Secondary | ICD-10-CM | POA: Diagnosis not present

## 2019-10-26 DIAGNOSIS — H02135 Senile ectropion of left lower eyelid: Secondary | ICD-10-CM | POA: Diagnosis not present

## 2019-10-26 DIAGNOSIS — Q101 Congenital ectropion: Secondary | ICD-10-CM | POA: Diagnosis not present

## 2019-12-04 DIAGNOSIS — Z20822 Contact with and (suspected) exposure to covid-19: Secondary | ICD-10-CM | POA: Diagnosis not present

## 2019-12-04 DIAGNOSIS — Z01812 Encounter for preprocedural laboratory examination: Secondary | ICD-10-CM | POA: Diagnosis not present

## 2019-12-06 DIAGNOSIS — H57813 Brow ptosis, bilateral: Secondary | ICD-10-CM | POA: Diagnosis not present

## 2019-12-06 DIAGNOSIS — H02413 Mechanical ptosis of bilateral eyelids: Secondary | ICD-10-CM | POA: Diagnosis not present

## 2019-12-06 DIAGNOSIS — H02135 Senile ectropion of left lower eyelid: Secondary | ICD-10-CM | POA: Diagnosis not present

## 2019-12-06 DIAGNOSIS — Z7982 Long term (current) use of aspirin: Secondary | ICD-10-CM | POA: Diagnosis not present

## 2019-12-06 DIAGNOSIS — I10 Essential (primary) hypertension: Secondary | ICD-10-CM | POA: Diagnosis not present

## 2019-12-06 DIAGNOSIS — Z79899 Other long term (current) drug therapy: Secondary | ICD-10-CM | POA: Diagnosis not present

## 2019-12-06 DIAGNOSIS — Z8572 Personal history of non-Hodgkin lymphomas: Secondary | ICD-10-CM | POA: Diagnosis not present

## 2019-12-06 DIAGNOSIS — H02132 Senile ectropion of right lower eyelid: Secondary | ICD-10-CM | POA: Diagnosis not present

## 2019-12-08 DIAGNOSIS — M810 Age-related osteoporosis without current pathological fracture: Secondary | ICD-10-CM | POA: Diagnosis not present

## 2019-12-08 DIAGNOSIS — I10 Essential (primary) hypertension: Secondary | ICD-10-CM | POA: Diagnosis not present

## 2019-12-08 DIAGNOSIS — Z6826 Body mass index (BMI) 26.0-26.9, adult: Secondary | ICD-10-CM | POA: Diagnosis not present

## 2019-12-08 DIAGNOSIS — M1990 Unspecified osteoarthritis, unspecified site: Secondary | ICD-10-CM | POA: Diagnosis not present

## 2019-12-08 DIAGNOSIS — I350 Nonrheumatic aortic (valve) stenosis: Secondary | ICD-10-CM | POA: Diagnosis not present

## 2019-12-08 DIAGNOSIS — M8580 Other specified disorders of bone density and structure, unspecified site: Secondary | ICD-10-CM | POA: Diagnosis not present

## 2019-12-08 DIAGNOSIS — Z8572 Personal history of non-Hodgkin lymphomas: Secondary | ICD-10-CM | POA: Diagnosis not present

## 2020-01-05 DIAGNOSIS — Z6826 Body mass index (BMI) 26.0-26.9, adult: Secondary | ICD-10-CM | POA: Diagnosis not present

## 2020-01-05 DIAGNOSIS — I350 Nonrheumatic aortic (valve) stenosis: Secondary | ICD-10-CM | POA: Diagnosis not present

## 2020-03-03 DIAGNOSIS — Z6826 Body mass index (BMI) 26.0-26.9, adult: Secondary | ICD-10-CM | POA: Diagnosis not present

## 2020-03-03 DIAGNOSIS — C8200 Follicular lymphoma grade I, unspecified site: Secondary | ICD-10-CM | POA: Diagnosis not present

## 2020-03-31 ENCOUNTER — Other Ambulatory Visit: Payer: Self-pay

## 2020-03-31 ENCOUNTER — Encounter: Payer: Self-pay | Admitting: Family Medicine

## 2020-03-31 ENCOUNTER — Ambulatory Visit (INDEPENDENT_AMBULATORY_CARE_PROVIDER_SITE_OTHER): Payer: Medicare Other | Admitting: Family Medicine

## 2020-03-31 VITALS — BP 128/72 | HR 66 | Temp 98.1°F | Resp 14 | Ht 63.0 in | Wt 149.0 lb

## 2020-03-31 DIAGNOSIS — I350 Nonrheumatic aortic (valve) stenosis: Secondary | ICD-10-CM | POA: Diagnosis not present

## 2020-03-31 DIAGNOSIS — I10 Essential (primary) hypertension: Secondary | ICD-10-CM | POA: Diagnosis not present

## 2020-03-31 DIAGNOSIS — C8280 Other types of follicular lymphoma, unspecified site: Secondary | ICD-10-CM | POA: Diagnosis not present

## 2020-03-31 DIAGNOSIS — Z23 Encounter for immunization: Secondary | ICD-10-CM

## 2020-03-31 DIAGNOSIS — M1A49X0 Other secondary chronic gout, multiple sites, without tophus (tophi): Secondary | ICD-10-CM

## 2020-03-31 DIAGNOSIS — R7303 Prediabetes: Secondary | ICD-10-CM

## 2020-03-31 DIAGNOSIS — E785 Hyperlipidemia, unspecified: Secondary | ICD-10-CM

## 2020-03-31 NOTE — Progress Notes (Signed)
   Subjective:    Patient ID: Lucas Pugh, male    DOB: 1940/08/01, 79 y.o.   MRN: [id number]  Patient presents for Follow-up (is fasting)   Pt here to f/u chronic medical problems  Medications reviewed  HTN/ aortic valve disorder- followed by Dr. Michiel Cowboy,   on provatatin, taking lisinopril and norvasc  BP at home has been 120's/ 70's   No chest pain, no SOB   Dr. Allison Quarry - new Geriatric medicine physician at [facility] , no changes   Hyperlipidemia on statin drug     Non Hodkins Lymphoma in remission- followed Dr. Lonia Blood, has f/u in 1 year   He had an eyelid left lower eyelid tact to help with tearing   Due for flu shot today   Review Of Systems:  GEN- denies fatigue, fever, weight loss,weakness, recent illness HEENT- denies eye drainage, change in vision, nasal discharge, CVS- denies chest pain, palpitations RESP- denies SOB, cough, wheeze ABD- denies N/V, change in stools, abd pain GU- denies dysuria, hematuria, dribbling, incontinence MSK- denies joint pain, muscle aches, injury Neuro- denies headache, dizziness, syncope, seizure activity       Objective:    BP 128/72   Pulse 66   Temp 98.1 F (36.7 C) (Temporal)   Resp 14   Ht 5\' 3"  (1.6 m)   Wt 149 lb (67.6 kg)   SpO2 97%   BMI 26.39 kg/m  GEN- NAD, alert and oriented x3 HEENT- PERRL, EOMI, non injected sclera, pink conjunctiva, MMM, oropharynx clear Neck- Supple, no thyromegaly CVS- RRR, systolic murmur RESP-CTAB ABD-NABS,soft,NT,ND EXT- No edema Pulses- Radial, DP- 2+        Assessment & Plan:      Problem List Items Addressed This Visit      Unprioritized   Aortic stenosis   Borderline diabetes   Relevant Orders   Hemoglobin A1c (Completed)   Gout   Relevant Orders   Uric Acid (Completed)   Hyperlipidemia   Relevant Orders   Lipid panel (Completed)   Hypertension - Primary    Controlled, no symptoms from AS No changes Continue with specialist Labs obtained      Relevant Orders    CBC with Differential/Platelet (Completed)   Comprehensive metabolic panel (Completed)   Non Hodgkin's lymphoma ([facility])    Currently in remission, reviewed last oncology note He feels well, maintaining weight        Other Visit Diagnoses    Need for immunization against influenza       Relevant Orders   Flu Vaccine QUAD High Dose(Fluad) (Completed)      Note: This dictation was prepared with Dragon dictation along with smaller phrase technology. Any transcriptional errors that result from this process are unintentional.

## 2020-03-31 NOTE — Patient Instructions (Signed)
F/U 6 months for Physical  

## 2020-04-01 LAB — LIPID PANEL
Cholesterol: 146 mg/dL (ref <200)
HDL: 52 mg/dL (ref >=40)
LDL Cholesterol (Calc): 77 mg/dL (calc)
Non-HDL Cholesterol (Calc): 94 mg/dL (calc) (ref <130)
Total CHOL/HDL Ratio: 2.8 (calc) (ref <5.0)
Triglycerides: 87 mg/dL (ref <150)

## 2020-04-01 LAB — CBC WITH DIFFERENTIAL/PLATELET
Absolute Monocytes: 670 cells/uL (ref 200–950)
Basophils Absolute: 143 cells/uL (ref 0–200)
Basophils Relative: 2.2 %
Eosinophils Absolute: 293 cells/uL (ref 15–500)
Eosinophils Relative: 4.5 %
HCT: 47.7 % (ref 38.5–50.0)
Hemoglobin: 16.1 g/dL (ref 13.2–17.1)
Lymphs Abs: 1326 cells/uL (ref 850–3900)
MCH: 33 pg (ref 27.0–33.0)
MCHC: 33.8 g/dL (ref 32.0–36.0)
MCV: 97.7 fL (ref 80.0–100.0)
MPV: 9.9 fL (ref 7.5–12.5)
Monocytes Relative: 10.3 %
Neutro Abs: 4069 cells/uL (ref 1500–7800)
Neutrophils Relative %: 62.6 %
Platelets: 210 10*3/uL (ref 140–400)
RBC: 4.88 10*6/uL (ref 4.20–5.80)
RDW: 12.7 % (ref 11.0–15.0)
Total Lymphocyte: 20.4 %
WBC: 6.5 10*3/uL (ref 3.8–10.8)

## 2020-04-01 LAB — COMPREHENSIVE METABOLIC PANEL
AG Ratio: 1.9 (calc) (ref 1.0–2.5)
ALT: 13 U/L (ref 9–46)
AST: 17 U/L (ref 10–35)
Albumin: 4.2 g/dL (ref 3.6–5.1)
Alkaline phosphatase (APISO): 60 U/L (ref 35–144)
BUN: 18 mg/dL (ref 7–25)
CO2: 22 mmol/L (ref 20–32)
Calcium: 9.4 mg/dL (ref 8.6–10.3)
Chloride: 105 mmol/L (ref 98–110)
Creat: 0.96 mg/dL (ref 0.70–1.11)
Globulin: 2.2 g/dL (calc) (ref 1.9–3.7)
Glucose, Bld: 104 mg/dL — ABNORMAL HIGH (ref 65–99)
Potassium: 4.2 mmol/L (ref 3.5–5.3)
Sodium: 137 mmol/L (ref 135–146)
Total Bilirubin: 0.7 mg/dL (ref 0.2–1.2)
Total Protein: 6.4 g/dL (ref 6.1–8.1)

## 2020-04-01 LAB — HEMOGLOBIN A1C
Hgb A1c MFr Bld: 5 % of total Hgb (ref <5.7)
Mean Plasma Glucose: 97 (calc)
eAG (mmol/L): 5.4 (calc)

## 2020-04-01 LAB — URIC ACID: Uric Acid, Serum: 3.6 mg/dL — ABNORMAL LOW (ref 4.0–8.0)

## 2020-04-02 ENCOUNTER — Encounter: Payer: Self-pay | Admitting: Family Medicine

## 2020-04-02 NOTE — Assessment & Plan Note (Signed)
Currently in remission, reviewed last oncology note He feels well, maintaining weight

## 2020-04-02 NOTE — Assessment & Plan Note (Signed)
Controlled, no symptoms from AS No changes Continue with specialist Labs obtained

## 2020-04-11 DIAGNOSIS — H57813 Brow ptosis, bilateral: Secondary | ICD-10-CM | POA: Diagnosis not present

## 2020-04-11 DIAGNOSIS — H02413 Mechanical ptosis of bilateral eyelids: Secondary | ICD-10-CM | POA: Diagnosis not present

## 2020-04-11 DIAGNOSIS — H02135 Senile ectropion of left lower eyelid: Secondary | ICD-10-CM | POA: Diagnosis not present

## 2020-04-11 DIAGNOSIS — H02132 Senile ectropion of right lower eyelid: Secondary | ICD-10-CM | POA: Diagnosis not present

## 2020-04-24 ENCOUNTER — Other Ambulatory Visit: Payer: Self-pay | Admitting: Family Medicine

## 2020-04-24 MED ORDER — TAMSULOSIN HCL 0.4 MG PO CAPS: 0.4000 mg | ORAL_CAPSULE | Freq: Every evening | ORAL | 3 refills | Status: DC

## 2020-04-24 NOTE — Telephone Encounter (Signed)
Flomax refill sent to [facility] fax to [phone]

## 2020-04-24 NOTE — Telephone Encounter (Signed)
Prescription sent to pharmacy.

## 2020-05-01 ENCOUNTER — Other Ambulatory Visit: Payer: Self-pay | Admitting: Family Medicine

## 2020-05-01 DIAGNOSIS — L814 Other melanin hyperpigmentation: Secondary | ICD-10-CM | POA: Diagnosis not present

## 2020-05-01 DIAGNOSIS — D225 Melanocytic nevi of trunk: Secondary | ICD-10-CM | POA: Diagnosis not present

## 2020-05-01 DIAGNOSIS — L821 Other seborrheic keratosis: Secondary | ICD-10-CM | POA: Diagnosis not present

## 2020-05-01 DIAGNOSIS — Z1283 Encounter for screening for malignant neoplasm of skin: Secondary | ICD-10-CM | POA: Diagnosis not present

## 2020-05-01 DIAGNOSIS — L720 Epidermal cyst: Secondary | ICD-10-CM | POA: Diagnosis not present

## 2020-05-01 DIAGNOSIS — L818 Other specified disorders of pigmentation: Secondary | ICD-10-CM | POA: Diagnosis not present

## 2020-05-29 DIAGNOSIS — M659 Synovitis and tenosynovitis, unspecified: Secondary | ICD-10-CM | POA: Diagnosis not present

## 2020-05-29 DIAGNOSIS — M79641 Pain in right hand: Secondary | ICD-10-CM | POA: Diagnosis not present

## 2020-06-06 DIAGNOSIS — C8590 Non-Hodgkin lymphoma, unspecified, unspecified site: Secondary | ICD-10-CM | POA: Diagnosis not present

## 2020-06-06 DIAGNOSIS — I10 Essential (primary) hypertension: Secondary | ICD-10-CM | POA: Diagnosis not present

## 2020-06-06 DIAGNOSIS — R0989 Other specified symptoms and signs involving the circulatory and respiratory systems: Secondary | ICD-10-CM | POA: Diagnosis not present

## 2020-06-06 DIAGNOSIS — N138 Other obstructive and reflux uropathy: Secondary | ICD-10-CM | POA: Diagnosis not present

## 2020-06-06 DIAGNOSIS — N401 Enlarged prostate with lower urinary tract symptoms: Secondary | ICD-10-CM | POA: Diagnosis not present

## 2020-06-06 DIAGNOSIS — Z79899 Other long term (current) drug therapy: Secondary | ICD-10-CM | POA: Diagnosis not present

## 2020-06-06 DIAGNOSIS — M109 Gout, unspecified: Secondary | ICD-10-CM | POA: Diagnosis not present

## 2020-06-06 DIAGNOSIS — C8200 Follicular lymphoma grade I, unspecified site: Secondary | ICD-10-CM | POA: Diagnosis not present

## 2020-06-06 DIAGNOSIS — E7849 Other hyperlipidemia: Secondary | ICD-10-CM | POA: Diagnosis not present

## 2020-06-06 DIAGNOSIS — Z6826 Body mass index (BMI) 26.0-26.9, adult: Secondary | ICD-10-CM | POA: Diagnosis not present

## 2020-06-06 DIAGNOSIS — N139 Obstructive and reflux uropathy, unspecified: Secondary | ICD-10-CM | POA: Diagnosis not present

## 2020-06-06 DIAGNOSIS — M720 Palmar fascial fibromatosis [Dupuytren]: Secondary | ICD-10-CM | POA: Diagnosis not present

## 2020-06-06 DIAGNOSIS — Z952 Presence of prosthetic heart valve: Secondary | ICD-10-CM | POA: Diagnosis not present

## 2020-06-15 DIAGNOSIS — M19041 Primary osteoarthritis, right hand: Secondary | ICD-10-CM | POA: Diagnosis not present

## 2020-06-26 DIAGNOSIS — H2511 Age-related nuclear cataract, right eye: Secondary | ICD-10-CM | POA: Diagnosis not present

## 2020-06-26 DIAGNOSIS — H57813 Brow ptosis, bilateral: Secondary | ICD-10-CM | POA: Diagnosis not present

## 2020-06-26 DIAGNOSIS — H2512 Age-related nuclear cataract, left eye: Secondary | ICD-10-CM | POA: Diagnosis not present

## 2020-07-17 DIAGNOSIS — M19049 Primary osteoarthritis, unspecified hand: Secondary | ICD-10-CM | POA: Diagnosis not present

## 2020-07-17 DIAGNOSIS — M79641 Pain in right hand: Secondary | ICD-10-CM | POA: Diagnosis not present

## 2020-08-21 ENCOUNTER — Telehealth: Payer: Self-pay | Admitting: Family Medicine

## 2020-08-21 MED ORDER — AMLODIPINE BESYLATE 10 MG PO TABS: 10.0000 mg | ORAL_TABLET | Freq: Every day | ORAL | 3 refills | Status: AC

## 2020-08-21 MED ORDER — LISINOPRIL 10 MG PO TABS: 10.0000 mg | ORAL_TABLET | Freq: Every evening | ORAL | 1 refills | Status: AC

## 2020-08-21 MED ORDER — ALLOPURINOL 300 MG PO TABS: 300.0000 mg | ORAL_TABLET | Freq: Every day | ORAL | 3 refills | Status: AC

## 2020-08-21 MED ORDER — PRAVASTATIN SODIUM 20 MG PO TABS: 20.0000 mg | ORAL_TABLET | Freq: Every evening | ORAL | 3 refills | Status: AC

## 2020-08-21 MED ORDER — TAMSULOSIN HCL 0.4 MG PO CAPS: 0.4000 mg | ORAL_CAPSULE | Freq: Every evening | ORAL | 3 refills | Status: AC

## 2020-08-21 NOTE — Telephone Encounter (Addendum)
[facility] is requesting refill  [facility] order # [id number] [facility] faxed request on  lisinopril (ZESTRIL) 10 MG tablet [[id number]]  amLODipine (NORVASC) 10 MG tablet [[id number] pravastatin (PRAVACHOL) 20 MG tablet [[id number]] allopurinol (ZYLOPRIM) 300 MG tablet [[id number]]  tamsulosin (FLOMAX) 0.4 MG CAPS capsule [[id number]

## 2020-08-21 NOTE — Telephone Encounter (Deleted)
tamsulosin (FLOMAX) 0.4 MG CAPS capsule [[id number] allopurinol (ZYLOPRIM) 300 MG tablet [[id number]

## 2020-08-21 NOTE — Telephone Encounter (Signed)
Prescription sent to pharmacy.

## 2020-10-02 ENCOUNTER — Encounter: Payer: Medicare Other | Admitting: Family Medicine

## 2021-06-19 ENCOUNTER — Telehealth: Payer: Self-pay | Admitting: Family Medicine

## 2021-06-19 NOTE — Telephone Encounter (Signed)
Refill denied. Pt has not been seen >1 yr and has not established care with new PCP. Spoke with rep at [facility] and advised. Nothing further needed.

## 2021-06-19 NOTE — Telephone Encounter (Signed)
Received call from [place] at [facility] to follow up on patient's refill request of   pravastatin (PRAVACHOL) 20 MG tablet [[id number]]  Pharmacy contact information confirmed as  [facility] - [place], [facility]  [address] [place] [address], [place] [address]  Phone:  [phone]  Fax:  [phone]   Please advise at [phone] and give reference #[id number].

## 2021-12-06 DIAGNOSIS — Z7189 Other specified counseling: Secondary | ICD-10-CM | POA: Insufficient documentation

## 2022-11-18 DIAGNOSIS — M545 Low back pain, unspecified: Secondary | ICD-10-CM

## 2022-11-18 HISTORY — DX: Low back pain, unspecified: M54.50

## 2022-11-27 ENCOUNTER — Encounter: Payer: Self-pay | Admitting: Family Medicine

## 2022-11-27 ENCOUNTER — Ambulatory Visit (INDEPENDENT_AMBULATORY_CARE_PROVIDER_SITE_OTHER): Payer: Medicare Other | Admitting: Family Medicine

## 2022-11-27 VITALS — BP 116/70 | HR 81 | Temp 97.5°F | Ht 63.0 in | Wt 152.0 lb

## 2022-11-27 DIAGNOSIS — I350 Nonrheumatic aortic (valve) stenosis: Secondary | ICD-10-CM | POA: Diagnosis not present

## 2022-11-27 DIAGNOSIS — Z7689 Persons encountering health services in other specified circumstances: Secondary | ICD-10-CM

## 2022-11-27 DIAGNOSIS — C8280 Other types of follicular lymphoma, unspecified site: Secondary | ICD-10-CM

## 2022-11-27 DIAGNOSIS — I10 Essential (primary) hypertension: Secondary | ICD-10-CM | POA: Diagnosis not present

## 2022-11-27 DIAGNOSIS — E785 Hyperlipidemia, unspecified: Secondary | ICD-10-CM | POA: Diagnosis not present

## 2022-11-27 NOTE — Assessment & Plan Note (Signed)
Possibly recurrent. CT planned for later this month. Followed by Oncology.

## 2022-11-27 NOTE — Progress Notes (Signed)
New Patient Office Visit  Subjective    Patient ID: Lucas Pugh, male    DOB: [birth year]  Age: 82 y.o. MRN: [id number]  CC:  Chief Complaint  Patient presents with   Establish Care    HPI Lucas Pugh presents to establish care. Oriented to practice routines and expectations. PMH as listed below. He is currently seeing an Orthopedist for back pain and is considering spinal injections, cardiology, ophthalmology, and oncology for his non Hodgkin's lymphoma. Current concerns include his BP inconsistencies. He is prescribed 5mg  Amlodipine daily and takes somewhere between 5-10mg  daily and Lisinopril 10mg  daily depending on his BP readings. His monthly averages have been 136/79 in Jan, 140/82 in Feb, 133/81 in March, and now 124/74 in April. Needs an aortic valve replacement but waiting to see results of CT scan on May 14 for evaluation of possible return of his lymphoma. He denies chest pain, shortness of breath, palpitations, vision changes, recurrent headaches, or swelling of extremities. He has started strength exercises and stretching this month.   Outpatient Encounter Medications as of 11/27/2022  Medication Sig   allopurinol (ZYLOPRIM) 300 MG tablet Take 1 tablet (300 mg total) by mouth daily.   amLODipine (NORVASC) 10 MG tablet Take 1 tablet (10 mg total) by mouth daily.   Calcium Carbonate-Vitamin D 300-100 MG-UNIT CAPS Take 1 tablet by mouth daily.   cholecalciferol (VITAMIN D3) 25 MCG (1000 UT) tablet Take 1,000 Units by mouth 2 (two) times daily.   finasteride (PROSCAR) 5 MG tablet Take 5 mg by mouth daily.   lisinopril (ZESTRIL) 10 MG tablet Take 1 tablet (10 mg total) by mouth every evening.   Multiple Vitamin (MULTIVITAMIN WITH MINERALS) TABS tablet Take 0.5 tablets by mouth daily.   pravastatin (PRAVACHOL) 20 MG tablet Take 1 tablet (20 mg total) by mouth every evening.   psyllium (METAMUCIL) 58.6 % powder Take 1 packet by mouth 3 (three) times daily.   tamsulosin (FLOMAX) 0.4 MG  CAPS capsule Take 1 capsule (0.4 mg total) by mouth every evening.   aspirin 81 MG chewable tablet Chew 81 mg by mouth daily. (Patient not taking: Reported on 11/27/2022)   Menaquinone-7 (VITAMIN K2 PO) Take by mouth. (Patient not taking: Reported on 11/27/2022)   No facility-administered encounter medications on file as of 11/27/2022.    Past Medical History:  Diagnosis Date   Abnormal EKG    follow by [facility] in [place]   Recent Echo Old inferior MI   Aortic stenosis    mild by 10/2015 echo   Borderline diabetes    Gout    Heart murmur, aortic    History of meniscal tear    L knee   Hypertension    Left patella fracture    Lumbar spine pain 11/18/2022   neck pain   Non Hodgkin's lymphoma (HCC)    currently in remission since 2003   Osteopenia    Primary localized osteoarthritis of knee    right   Primary localized osteoarthritis of right knee 06/12/2016   Wears glasses    Wears hearing aid    B/L    Past Surgical History:  Procedure Laterality Date   EYE SURGERY     eyelid lift   JOINT REPLACEMENT     KNEE ARTHROSCOPY WITH MEDIAL MENISECTOMY Right 08/05/2014   Dr Thurston Hole   TONSILECTOMY, ADENOIDECTOMY, BILATERAL MYRINGOTOMY AND TUBES  1945   TOTAL KNEE ARTHROPLASTY Right 06/24/2016   TOTAL KNEE ARTHROPLASTY Right 06/24/2016   Procedure:  RIGHT TOTAL KNEE ARTHROPLASTY;  Surgeon: Salvatore Marvel, MD;  Location: [facility] OR;  Service: Orthopedics;  Laterality: Right;   VASECTOMY  1981    Family History  Problem Relation Age of Onset   Hypertension Mother    Cancer Mother    Heart attack Father 9   Heart disease Father    Hypertension Father    Hypertension Sister    Diabetes Mellitus II Sister     Social History   Socioeconomic History   Marital status: Married    Spouse name: Lucas Pugh   Number of children: Not on file   Years of education: Not on file   Highest education level: Not on file  Occupational History   Not on file  Tobacco Use   Smoking  status: Never   Smokeless tobacco: Never  Vaping Use   Vaping Use: Never used  Substance and Sexual Activity   Alcohol use: Yes    Comment:  1 glass of wine 4/7 nights weekly   Drug use: No   Sexual activity: Not Currently  Other Topics Concern   Not on file  Social History Narrative   Not on file   Social Determinants of Health   Financial Resource Strain: Not on file  Food Insecurity: Not on file  Transportation Needs: Not on file  Physical Activity: Not on file  Stress: Not on file  Social Connections: Not on file  Intimate Partner Violence: Not on file    Review of Systems  Constitutional: Negative.   HENT: Negative.    Eyes: Negative.   Respiratory: Negative.    Cardiovascular: Negative.   Gastrointestinal: Negative.   Genitourinary: Negative.   Musculoskeletal: Negative.   Skin: Negative.   Neurological: Negative.   Endo/Heme/Allergies: Negative.   Psychiatric/Behavioral: Negative.    All other systems reviewed and are negative.       Objective    BP 116/70   Pulse 81   Temp (!) 97.5 F (36.4 C) (Oral)   Ht 5\' 3"  (1.6 m)   Wt 152 lb (68.9 kg)   SpO2 96%   BMI 26.93 kg/m   Physical Exam Vitals and nursing note reviewed.  Constitutional:      Appearance: Normal appearance. He is normal weight.  HENT:     Head: Normocephalic and atraumatic.  Cardiovascular:     Rate and Rhythm: Normal rate and regular rhythm.     Pulses: Normal pulses.     Heart sounds: Murmur heard.  Pulmonary:     Effort: Pulmonary effort is normal.     Breath sounds: Normal breath sounds.  Abdominal:     General: Bowel sounds are normal.     Palpations: Abdomen is soft.  Skin:    General: Skin is warm and dry.     Capillary Refill: Capillary refill takes less than 2 seconds.  Neurological:     General: No focal deficit present.     Mental Status: He is alert and oriented to person, place, and time. Mental status is at baseline.  Psychiatric:        Mood and Affect:  Mood normal.        Behavior: Behavior normal.        Thought Content: Thought content normal.        Judgment: Judgment normal.         Assessment & Plan:   Problem List Items Addressed This Visit     Aortic stenosis    Followed by cardiology. Plans  for valve replacement surgery after CT evaluation for possible return of Hodgkin's Lymphoma.      Hypertension - Primary    Chronic. Well controlled. Continue Amlodipine 5mg  daily and Lisinopril 10mg  daily. Denies chest pain, palpitations, shortnes of breath, vision changes, recurrent headaches, or swelling of extremities and will seek medical care for any of these. Continue heart healthy diet and exercise as tolerated.      Relevant Orders   CBC with Differential/Platelet   COMPLETE METABOLIC PANEL WITH GFR   Hemoglobin A1c   Non Hodgkin's lymphoma (HCC)    Possibly recurrent. CT planned for later this month. Followed by Oncology.      Hyperlipidemia   Relevant Orders   Lipid panel   Hemoglobin A1c   Encounter to establish care with new doctor    Today your medical history was reviewed and current concerns addressed. Health maintenance items are up to date according to your medical records. Fasting labs ordered. Follow up in 6 months.       Return in about 6 months (around 05/30/2023).   Park Meo, FNP

## 2022-11-27 NOTE — Assessment & Plan Note (Signed)
Chronic. Well controlled. Continue Amlodipine 5mg  daily and Lisinopril 10mg  daily. Denies chest pain, palpitations, shortnes of breath, vision changes, recurrent headaches, or swelling of extremities and will seek medical care for any of these. Continue heart healthy diet and exercise as tolerated.

## 2022-11-27 NOTE — Patient Instructions (Signed)
It was great to meet you today and I'm excited to have you join the [facility] practice. I hope you had a positive experience today! If you feel so inclined, please feel free to recommend our practice to friends and family. Sherrian Nunnelley, FNP-C  

## 2022-11-27 NOTE — Assessment & Plan Note (Addendum)
Today your medical history was reviewed and current concerns addressed. Health maintenance items are up to date according to your medical records. Fasting labs ordered. Follow up in 6 months.

## 2022-11-27 NOTE — Assessment & Plan Note (Signed)
Followed by cardiology. Plans for valve replacement surgery after CT evaluation for possible return of Hodgkin's Lymphoma.

## 2022-11-28 ENCOUNTER — Other Ambulatory Visit: Payer: Medicare Other

## 2022-11-28 LAB — CBC WITH DIFFERENTIAL/PLATELET
Basophils Relative: 1.7 %
Eosinophils Relative: 3.9 %
Hemoglobin: 16 g/dL (ref 13.2–17.1)
Lymphs Abs: 1478 cells/uL (ref 850–3900)
MCH: 33.1 pg — ABNORMAL HIGH (ref 27.0–33.0)
MCHC: 34 g/dL (ref 32.0–36.0)
MPV: 9.7 fL (ref 7.5–12.5)
Monocytes Relative: 8.9 %

## 2022-11-29 LAB — CBC WITH DIFFERENTIAL/PLATELET
Absolute Monocytes: 587 cells/uL (ref 200–950)
Basophils Absolute: 112 cells/uL (ref 0–200)
Eosinophils Absolute: 257 cells/uL (ref 15–500)
HCT: 47.1 % (ref 38.5–50.0)
MCV: 97.5 fL (ref 80.0–100.0)
Neutro Abs: 4165 cells/uL (ref 1500–7800)
Neutrophils Relative %: 63.1 %
Platelets: 193 10*3/uL (ref 140–400)
RBC: 4.83 10*6/uL (ref 4.20–5.80)
RDW: 13 % (ref 11.0–15.0)
Total Lymphocyte: 22.4 %
WBC: 6.6 10*3/uL (ref 3.8–10.8)

## 2022-11-29 LAB — COMPLETE METABOLIC PANEL WITH GFR
AG Ratio: 2.2 (calc) (ref 1.0–2.5)
ALT: 18 U/L (ref 9–46)
AST: 15 U/L (ref 10–35)
Albumin: 4.2 g/dL (ref 3.6–5.1)
Alkaline phosphatase (APISO): 53 U/L (ref 35–144)
BUN: 19 mg/dL (ref 7–25)
CO2: 27 mmol/L (ref 20–32)
Calcium: 9.5 mg/dL (ref 8.6–10.3)
Chloride: 104 mmol/L (ref 98–110)
Creat: 0.93 mg/dL (ref 0.70–1.22)
Globulin: 1.9 g/dL (calc) (ref 1.9–3.7)
Glucose, Bld: 105 mg/dL — ABNORMAL HIGH (ref 65–99)
Potassium: 4.1 mmol/L (ref 3.5–5.3)
Sodium: 138 mmol/L (ref 135–146)
Total Bilirubin: 0.5 mg/dL (ref 0.2–1.2)
Total Protein: 6.1 g/dL (ref 6.1–8.1)
eGFR: 82 mL/min/{1.73_m2} (ref >=60)

## 2022-11-29 LAB — LIPID PANEL
Cholesterol: 150 mg/dL (ref <200)
HDL: 52 mg/dL (ref >=40)
LDL Cholesterol (Calc): 75 mg/dL (calc)
Non-HDL Cholesterol (Calc): 98 mg/dL (calc) (ref <130)
Total CHOL/HDL Ratio: 2.9 (calc) (ref <5.0)
Triglycerides: 143 mg/dL (ref <150)

## 2022-11-29 LAB — HEMOGLOBIN A1C
Hgb A1c MFr Bld: 5.8 % of total Hgb — ABNORMAL HIGH (ref <5.7)
Mean Plasma Glucose: 120 mg/dL
eAG (mmol/L): 6.6 mmol/L

## 2022-12-03 ENCOUNTER — Encounter: Payer: Self-pay | Admitting: Family Medicine

## 2023-06-02 ENCOUNTER — Ambulatory Visit: Payer: Medicare Other | Admitting: Family Medicine

## 2023-06-04 ENCOUNTER — Ambulatory Visit: Payer: Medicare Other | Admitting: Family Medicine

## 2023-07-15 ENCOUNTER — Ambulatory Visit (INDEPENDENT_AMBULATORY_CARE_PROVIDER_SITE_OTHER): Payer: Medicare Other | Admitting: Family Medicine

## 2023-07-15 ENCOUNTER — Encounter: Payer: Self-pay | Admitting: Family Medicine

## 2023-07-15 VITALS — BP 120/82 | HR 74 | Temp 97.5°F | Ht 63.0 in | Wt 158.2 lb

## 2023-07-15 DIAGNOSIS — C8280 Other types of follicular lymphoma, unspecified site: Secondary | ICD-10-CM

## 2023-07-15 DIAGNOSIS — I10 Essential (primary) hypertension: Secondary | ICD-10-CM | POA: Diagnosis not present

## 2023-07-15 DIAGNOSIS — I350 Nonrheumatic aortic (valve) stenosis: Secondary | ICD-10-CM

## 2023-07-15 DIAGNOSIS — Z0000 Encounter for general adult medical examination without abnormal findings: Secondary | ICD-10-CM | POA: Insufficient documentation

## 2023-07-15 DIAGNOSIS — Z0001 Encounter for general adult medical examination with abnormal findings: Secondary | ICD-10-CM | POA: Diagnosis not present

## 2023-07-15 DIAGNOSIS — E785 Hyperlipidemia, unspecified: Secondary | ICD-10-CM

## 2023-07-15 NOTE — Patient Instructions (Addendum)
  Lucas Pugh , Thank you for taking time to come for your Medicare Wellness Visit. I appreciate your ongoing commitment to your health goals. Please review the following plan we discussed and let me know if I can assist you in the future.   These are the goals we discussed:  Goals      DIET - REDUCE SUGAR INTAKE     Pt would like to maintain a healthy lifestyle and exercise more.         This is a list of the screening recommended for you and due dates:  Health Maintenance  Topic Date Due   COVID-19 Vaccine (12 - 2024-25 season) 05/27/2023   Medicare Annual Wellness Visit  07/14/2024   DTaP/Tdap/Td vaccine (4 - Td or Tdap) 12/18/2031   Pneumonia Vaccine  Completed   Flu Shot  Completed   Zoster (Shingles) Vaccine  Completed   HPV Vaccine  Aged Out

## 2023-07-15 NOTE — Progress Notes (Signed)
Subjective:  HPI: Lucas Pugh is a 82 y.o. male presenting on 07/15/2023 for Medicare Wellness   HPI Patient is in today for CPE and AWV with chronic condition follow-up. He has seen Cardiology and they are considering an aortic valve replacement. He is also seeing Oncology for his Lymphoma and just monitoring at this time, has CT planned in May. He did bring lab results from the [place] today with him. Has no additional concerns to address. His knee osteoarthritis is stable, he received knee injections from Ortho for this.   Oncology OV 06/18/2023 for reference only: SUMMARY: - Stage III Follicular Lymphoma diagnosed in 2003 - Completed 8 cycles of R-CHOP 04/2002 - CT chest/neck/abdomen 12/07/21: RP LAD  - CT CAP: 06/05/22 stable RP LAD - CBC stable; LDH normal; No symptoms - CT CAP 12/10/22 stable to prior - plan for CT CAP in 6 months  - [facility] months with CT CAP, then RTC yearly with CTs   Cardiology OV 06/20/2023 for reference only: Aortic Valve Stenosis: Stage B: Moderate Patient remains largely asymptomatic, with Echo (10/2021) demonstrating LVEF 55-60%, moderate to severe AV stenosis, peak velocity 3.57m/s, mean gradient Hg, estimate aortic valve area 1.0 cm2 consistent with Stage B Moderate AS. Will plan for ongoing annual monitoring -Asymptomatic, no changes on initial TTE review today.   HYPERTENSION / HYPERLIPIDEMIA Satisfied with current treatment? yes Duration of hypertension: chronic BP monitoring frequency: daily BP range:  BP medication side effects: no Past BP meds: amlodipine and lisinopril Duration of hyperlipidemia: chronic Cholesterol medication side effects: no Cholesterol supplements: none Past cholesterol medications: pravastatin (pravachol) Medication compliance: excellent compliance Aspirin: no Recent stressors: no Recurrent headaches: no Visual changes: no Palpitations: no Dyspnea: no Chest pain: no Lower  extremity edema: no Dizzy/lightheaded: no  Health Maintenance  Topic Date Due   COVID-19 Vaccine (12 - 2024-25 season) 05/27/2023   Medicare Annual Wellness Visit  07/14/2024   DTaP/Tdap/Td vaccine (4 - Td or Tdap) 12/18/2031   Pneumonia Vaccine  Completed   Flu Shot  Completed   Zoster (Shingles) Vaccine  Completed   HPV Vaccine  Aged Out    Review of Systems  Constitutional: Negative.   Respiratory: Negative.    Cardiovascular: Negative.   All other systems reviewed and are negative.   Relevant past medical history reviewed and updated as indicated.   Past Medical History:  Diagnosis Date   Abnormal EKG    follow by [facility] in [place]   Recent Echo Old inferior MI   Aortic stenosis    mild by 10/2015 echo   Borderline diabetes    Gout    Heart murmur, aortic    History of meniscal tear    L knee   Hypertension    Left patella fracture    Lumbar spine pain 11/18/2022   neck pain   Non Hodgkin's lymphoma (HCC)    currently in remission since 2003   Osteopenia    Primary localized osteoarthritis of knee    right   Primary localized osteoarthritis of right knee 06/12/2016   Wears glasses    Wears hearing aid    B/L     Past Surgical History:  Procedure Laterality Date   EYE SURGERY     eyelid lift   JOINT REPLACEMENT     KNEE ARTHROSCOPY WITH MEDIAL MENISECTOMY Right 08/05/2014   Dr Thurston Hole   TONSILECTOMY, ADENOIDECTOMY, BILATERAL MYRINGOTOMY AND TUBES  1945  TOTAL KNEE ARTHROPLASTY Right 06/24/2016   TOTAL KNEE ARTHROPLASTY Right 06/24/2016   Procedure: RIGHT TOTAL KNEE ARTHROPLASTY;  Surgeon: Salvatore Marvel, MD;  Location: [facility] OR;  Service: Orthopedics;  Laterality: Right;   VASECTOMY  1981    Allergies and medications reviewed and updated.   Current Outpatient Medications:    allopurinol (ZYLOPRIM) 300 MG tablet, Take 1 tablet (300 mg total) by mouth daily., Disp: 90 tablet, Rfl: 3   amLODipine (NORVASC) 10 MG tablet, Take 1 tablet (10 mg  total) by mouth daily., Disp: 90 tablet, Rfl: 3   Calcium Carbonate-Vitamin D 300-100 MG-UNIT CAPS, Take 1 tablet by mouth daily., Disp: , Rfl:    cholecalciferol (VITAMIN D3) 25 MCG (1000 UT) tablet, Take 1,000 Units by mouth 2 (two) times daily., Disp: , Rfl:    finasteride (PROSCAR) 5 MG tablet, Take 5 mg by mouth daily., Disp: , Rfl:    lisinopril (ZESTRIL) 10 MG tablet, Take 1 tablet (10 mg total) by mouth every evening., Disp: 90 tablet, Rfl: 1   Multiple Vitamin (MULTIVITAMIN WITH MINERALS) TABS tablet, Take 0.5 tablets by mouth daily., Disp: , Rfl:    pravastatin (PRAVACHOL) 20 MG tablet, Take 1 tablet (20 mg total) by mouth every evening., Disp: 90 tablet, Rfl: 3   psyllium (METAMUCIL) 58.6 % powder, Take 1 packet by mouth 3 (three) times daily., Disp: , Rfl:    tamsulosin (FLOMAX) 0.4 MG CAPS capsule, Take 1 capsule (0.4 mg total) by mouth every evening., Disp: 90 capsule, Rfl: 3   aspirin 81 MG chewable tablet, Chew 81 mg by mouth daily. (Patient not taking: Reported on 07/15/2023), Disp: , Rfl:    Menaquinone-7 (VITAMIN K2 PO), Take by mouth. (Patient not taking: Reported on 07/15/2023), Disp: , Rfl:   Allergies  Allergen Reactions   No Known Allergies     Objective:   BP 120/82 (BP Location: Left Arm)   Pulse 74   Temp (!) 97.5 F (36.4 C)   Ht 5\' 3"  (1.6 m)   Wt 158 lb 4 oz (71.8 kg)   SpO2 98%   BMI 28.03 kg/m      07/15/2023   11:22 AM 11/27/2022    9:51 AM 03/31/2020    9:03 AM  Vitals with BMI  Height 5\' 3"  5\' 3"  5\' 3"   Weight 158 lbs 4 oz 152 lbs 149 lbs  BMI 28.04 26.93 26.4  Systolic 120 116 643  Diastolic 82 70 72  Pulse 74 81 66     Physical Exam Vitals and nursing note reviewed.  Constitutional:      Appearance: Normal appearance. He is normal weight.  HENT:     Head: Normocephalic and atraumatic.  Cardiovascular:     Rate and Rhythm: Normal rate and regular rhythm.     Pulses: Normal pulses.     Heart sounds: Murmur heard.  Pulmonary:      Effort: Pulmonary effort is normal.     Breath sounds: Normal breath sounds.  Skin:    General: Skin is warm and dry.     Capillary Refill: Capillary refill takes less than 2 seconds.  Neurological:     General: No focal deficit present.     Mental Status: He is alert and oriented to person, place, and time. Mental status is at baseline.  Psychiatric:        Mood and Affect: Mood normal.        Behavior: Behavior normal.        Thought  Content: Thought content normal.        Judgment: Judgment normal.     Assessment & Plan:  Primary hypertension Assessment & Plan: Chronic well controlled. Continue Amlodipine and Lisinopril.  Recommend heart healthy diet such as Mediterranean diet with whole grains, fruits, vegetable, fish, lean meats, nuts, and olive oil. Limit salt. Encouraged moderate exercise as tolerated. Avoid tobacco products. Avoid excess alcohol. Take medications as prescribed and bring medications and blood pressure log with cuff to each office visit. Seek medical care for chest pain, palpitations, shortness of breath with exertion, dizziness/lightheadedness, vision changes, recurrent headaches, or swelling of extremities. Follow up in 6 months.   Nonrheumatic aortic valve stenosis Assessment & Plan: Chronic, no new symptoms, followed by cardiology, continue to monitor  Cardiology OV 06/20/2023 for reference only: Aortic Valve Stenosis: Stage B: Moderate Patient remains largely asymptomatic, with Echo (10/2021) demonstrating LVEF 55-60%, moderate to severe AV stenosis, peak velocity 3.87m/s, mean gradient Hg, estimate aortic valve area 1.0 cm2 consistent with Stage B Moderate AS. Will plan for ongoing annual monitoring -Asymptomatic, no changes on initial TTE review today.    Hyperlipidemia, unspecified hyperlipidemia type Assessment & Plan: Chronic well controlled. Continue Pravastatin.LDL 72 I recommend consuming a heart healthy diet such as Mediterranean diet or  DASH diet with whole grains, fruits, vegetable, fish, lean meats, nuts, and olive oil. Limit sweets and processed foods. I also encourage moderate intensity exercise 150 minutes weekly as tolerated. This is 3-5 times weekly for 30-50 minutes each session. Goal should be pace of 3 miles/hours, or walking 1.5 miles in 30 minutes. The ASCVD Risk score (Arnett DK, et al., 2019) failed to calculate for the following reasons:   The 2019 ASCVD risk score is only valid for ages 64 to 60    Other type of follicular lymphoma, unspecified body region [facility]) Assessment & Plan: Chronic, no new symptoms, followed by oncology, continue to monitor Oncology OV 06/18/2023 for reference only: SUMMARY: - Stage III Follicular Lymphoma diagnosed in 2003 - Completed 8 cycles of R-CHOP 04/2002 - CT chest/neck/abdomen 12/07/21: RP LAD  - CT CAP: 06/05/22 stable RP LAD - CBC stable; LDH normal; No symptoms - CT CAP 12/10/22 stable to prior - plan for CT CAP in 6 months  - [facility] months with CT CAP, then RTC yearly with CTs    Physical exam, annual Assessment & Plan: Today your medical history and labs were reviewed and routine physical exam performed. Recommend moderate intensity exercise weekly as tolerated and consuming a well-balanced diet. Advised to stop smoking if a smoker, avoid smoking if a non-smoker, limit alcohol consumption to 1 drink per day for women and 2 drinks per day for men, and avoid illicit drug use. Vaccine maintenance discussed. Appropriate health maintenance items reviewed. Return to office in 1 year for annual physical exam.    Encounter for Medicare annual wellness exam     Follow up plan: Return in 1 year (on 07/14/2024).  Park Meo, FNP

## 2023-07-15 NOTE — Assessment & Plan Note (Addendum)
Chronic, no new symptoms, followed by cardiology, continue to monitor  Cardiology OV 06/20/2023 for reference only: Aortic Valve Stenosis: Stage B: Moderate Patient remains largely asymptomatic, with Echo (10/2021) demonstrating LVEF 55-60%, moderate to severe AV stenosis, peak velocity 3.44m/s, mean gradient Hg, estimate aortic valve area 1.0 cm2 consistent with Stage B Moderate AS. Will plan for ongoing annual monitoring -Asymptomatic, no changes on initial TTE review today.

## 2023-07-15 NOTE — Assessment & Plan Note (Addendum)
Chronic well controlled. Continue Pravastatin.LDL 72 I recommend consuming a heart healthy diet such as Mediterranean diet or DASH diet with whole grains, fruits, vegetable, fish, lean meats, nuts, and olive oil. Limit sweets and processed foods. I also encourage moderate intensity exercise 150 minutes weekly as tolerated. This is 3-5 times weekly for 30-50 minutes each session. Goal should be pace of 3 miles/hours, or walking 1.5 miles in 30 minutes. The ASCVD Risk score (Arnett DK, et al., 2019) failed to calculate for the following reasons:   The 2019 ASCVD risk score is only valid for ages 7 to 81

## 2023-07-15 NOTE — Assessment & Plan Note (Addendum)
Chronic well controlled. Continue Amlodipine and Lisinopril.  Recommend heart healthy diet such as Mediterranean diet with whole grains, fruits, vegetable, fish, lean meats, nuts, and olive oil. Limit salt. Encouraged moderate exercise as tolerated. Avoid tobacco products. Avoid excess alcohol. Take medications as prescribed and bring medications and blood pressure log with cuff to each office visit. Seek medical care for chest pain, palpitations, shortness of breath with exertion, dizziness/lightheadedness, vision changes, recurrent headaches, or swelling of extremities. Follow up in 6 months.

## 2023-07-15 NOTE — Assessment & Plan Note (Signed)
Today your medical history and labs were reviewed and routine physical exam performed. Recommend moderate intensity exercise weekly as tolerated and consuming a well-balanced diet. Advised to stop smoking if a smoker, avoid smoking if a non-smoker, limit alcohol consumption to 1 drink per day for women and 2 drinks per day for men, and avoid illicit drug use. Vaccine maintenance discussed. Appropriate health maintenance items reviewed. Return to office in 1 year for annual physical exam.

## 2023-07-15 NOTE — Progress Notes (Signed)
Subjective:   Lucas Pugh is a 82 y.o. male who presents for Medicare Annual/Subsequent preventive examination.  Visit Complete: In person  Patient Medicare AWV questionnaire was completed by the patient on 07/15/2023; I have confirmed that all information answered by patient is correct and no changes since this date.  Cardiac Risk Factors include: advanced age (>77men, >73 women);male gender;hypertension;dyslipidemia     Objective:    Today's Vitals   07/15/23 1122  BP: 120/82  Pulse: 74  Temp: (!) 97.5 F (36.4 C)  SpO2: 98%  Weight: 158 lb 4 oz (71.8 kg)  Height: 5\' 3"  (1.6 m)   Body mass index is 28.03 kg/m.     09/27/2019   10:33 AM 09/22/2018    8:43 AM 06/24/2016    2:38 PM 06/14/2016   10:42 AM  Advanced Directives  Does Patient Have a Medical Advance Directive? Yes Yes Yes Yes  Type of Special educational needs teacher of [place];Living will Healthcare Power of [employer] of [place];Living will  Does patient want to make changes to medical advance directive? No - Patient declined  No - Patient declined No - Patient declined  Copy of Healthcare Power of Attorney in Chart?  Yes - validated most recent copy scanned in chart (See row information) Yes No - copy requested    Current Medications (verified) Outpatient Encounter Medications as of 07/15/2023  Medication Sig   allopurinol (ZYLOPRIM) 300 MG tablet Take 1 tablet (300 mg total) by mouth daily.   amLODipine (NORVASC) 10 MG tablet Take 1 tablet (10 mg total) by mouth daily.   Calcium Carbonate-Vitamin D 300-100 MG-UNIT CAPS Take 1 tablet by mouth daily.   cholecalciferol (VITAMIN D3) 25 MCG (1000 UT) tablet Take 1,000 Units by mouth 2 (two) times daily.   finasteride (PROSCAR) 5 MG tablet Take 5 mg by mouth daily.   lisinopril (ZESTRIL) 10 MG tablet Take 1 tablet (10 mg total) by mouth every evening.   Multiple Vitamin (MULTIVITAMIN WITH MINERALS) TABS tablet Take 0.5 tablets by mouth  daily.   pravastatin (PRAVACHOL) 20 MG tablet Take 1 tablet (20 mg total) by mouth every evening.   psyllium (METAMUCIL) 58.6 % powder Take 1 packet by mouth 3 (three) times daily.   tamsulosin (FLOMAX) 0.4 MG CAPS capsule Take 1 capsule (0.4 mg total) by mouth every evening.   aspirin 81 MG chewable tablet Chew 81 mg by mouth daily. (Patient not taking: Reported on 07/15/2023)   Menaquinone-7 (VITAMIN K2 PO) Take by mouth. (Patient not taking: Reported on 07/15/2023)   No facility-administered encounter medications on file as of 07/15/2023.    Allergies (verified) No known allergies   History: Past Medical History:  Diagnosis Date   Abnormal EKG    follow by [facility] in [place]   Recent Echo Old inferior MI   Aortic stenosis    mild by 10/2015 echo   Borderline diabetes    Gout    Heart murmur, aortic    History of meniscal tear    L knee   Hypertension    Left patella fracture    Lumbar spine pain 11/18/2022   neck pain   Non Hodgkin's lymphoma (HCC)    currently in remission since 2003   Osteopenia    Primary localized osteoarthritis of knee    right   Primary localized osteoarthritis of right knee 06/12/2016   Wears glasses    Wears hearing aid    B/L  Past Surgical History:  Procedure Laterality Date   EYE SURGERY     eyelid lift   JOINT REPLACEMENT     KNEE ARTHROSCOPY WITH MEDIAL MENISECTOMY Right 08/05/2014   Dr Thurston Hole   TONSILECTOMY, ADENOIDECTOMY, BILATERAL MYRINGOTOMY AND TUBES  1945   TOTAL KNEE ARTHROPLASTY Right 06/24/2016   TOTAL KNEE ARTHROPLASTY Right 06/24/2016   Procedure: RIGHT TOTAL KNEE ARTHROPLASTY;  Surgeon: Salvatore Marvel, MD;  [facility];  Service: Orthopedics;  Laterality: Right;   VASECTOMY  1981   Family History  Problem Relation Age of Onset   Hypertension Mother    Cancer Mother    Heart attack Father 66   Heart disease Father    Hypertension Father    Hypertension Sister    Diabetes Mellitus II Sister     Social History   Socioeconomic History   Marital status: Married    Spouse name: Aaren Bley   Number of children: Not on file   Years of education: Not on file   Highest education level: Master's degree (e.g., MA, MS, MEng, MEd, MSW, MBA)  Occupational History   Not on file  Tobacco Use   Smoking status: Never   Smokeless tobacco: Never  Vaping Use   Vaping status: Never Used  Substance and Sexual Activity   Alcohol use: Yes    Comment:  1 glass of wine 4/7 nights weekly   Drug use: No   Sexual activity: Not Currently  Other Topics Concern   Not on file  Social History Narrative   Not on file   Social Drivers of Health   Financial Resource Strain: Low Risk  (07/15/2023)   Overall Financial Resource Strain (CARDIA)    Difficulty of Paying Living Expenses: Not hard at all  Food Insecurity: No Food Insecurity (07/15/2023)   Hunger Vital Sign    Worried About Running Out of Food in the Last Year: Never true    Ran Out of Food in the Last Year: Never true  Transportation Needs: No Transportation Needs (07/15/2023)   PRAPARE - Administrator, Civil Service (Medical): No    Lack of Transportation (Non-Medical): No  Physical Activity: Sufficiently Active (07/15/2023)   Exercise Vital Sign    Days of Exercise per Week: 7 days    Minutes of Exercise per Session: 30 min  Recent Concern: Physical Activity - Insufficiently Active (07/14/2023)   Exercise Vital Sign    Days of Exercise per Week: 2 days    Minutes of Exercise per Session: 30 min  Stress: No Stress Concern Present (07/15/2023)   [employer] of Occupational Health - Occupational Stress Questionnaire    Feeling of Stress : Only a little  Social Connections: Socially Integrated (07/15/2023)   Social Connection and Isolation Panel [NHANES]    Frequency of Communication with Friends and Family: More than three times a week    Frequency of Social Gatherings with Friends and Family: More than three  times a week    Attends Religious Services: 1 to 4 times per year    Active Member of [employer] or Organizations: Yes    Attends Banker Meetings: 1 to 4 times per year    Marital Status: Married    Tobacco Counseling Counseling given: Not Answered   Clinical Intake:  Pre-visit preparation completed: Yes  Pain : No/denies pain     BMI - recorded: 28.03 Nutritional Status: BMI 25 -29 Overweight Nutritional Risks: None Diabetes: No  How often do you  need to have someone help you when you read instructions, pamphlets, or other written materials from your doctor or pharmacy?: 1 - Never What is the last grade level you completed in school?: Masters Degree  Interpreter Needed?: No      Activities of Daily Living    07/15/2023   11:45 AM  In your present state of health, do you have any difficulty performing the following activities:  Hearing? 0  Vision? 0  Difficulty concentrating or making decisions? 0  Walking or climbing stairs? 1  Comment Pt reports bad L knee.  Dressing or bathing? 0  Doing errands, shopping? 0  Preparing Food and eating ? N  Using the Toilet? N  In the past six months, have you accidently leaked urine? N  Do you have problems with loss of bowel control? N  Managing your Medications? N  Managing your Finances? N  Housekeeping or managing your Housekeeping? N    Patient Care Team: Park Meo, FNP as PCP - General (Family Medicine)  Indicate any recent Medical Services you may have received from other than [facility] providers in the past year (date may be approximate).     Assessment:   This is a routine wellness examination for Ofir.  Hearing/Vision screen Hearing Screening - Comments:: Wears hearing aids- sees audiologist.  Vision Screening - Comments:: Wears glasses. Sees eye doctor annually.    Goals Addressed             This Visit's Progress    DIET - REDUCE SUGAR INTAKE       Pt would like to maintain a healthy  lifestyle and exercise more.        Depression Screen    07/15/2023   11:50 AM 07/15/2023   11:24 AM 09/27/2019   10:31 AM 03/24/2019   11:05 AM 09/22/2018    8:41 AM 08/12/2017    9:46 AM  PHQ 2/9 Scores  PHQ - 2 Score 0 0 0 0 0 0  PHQ- 9 Score    2  0    Fall Risk    07/15/2023   11:52 AM 07/15/2023   11:24 AM 11/27/2022   11:14 AM 09/27/2019   10:31 AM 03/24/2019   11:05 AM  Fall Risk   Falls in the past year? 0 0 0 0 0  Number falls in past yr: 0 0 0    Injury with Fall? 0 0 0    Risk for fall due to :   No Fall Risks No Fall Risks   Follow up   Education provided;Falls evaluation completed Falls evaluation completed Falls evaluation completed    MEDICARE RISK AT HOME: Medicare Risk at Home Any stairs in or around the home?: Yes If so, are there any without handrails?: No Home free of loose throw rugs in walkways, pet beds, electrical cords, etc?: Yes Adequate lighting in your home to reduce risk of falls?: Yes Life alert?: No Use of a cane, walker or w/c?: No Grab bars in the bathroom?: No Shower chair or bench in shower?: Yes Elevated toilet seat or a handicapped toilet?: No  TIMED UP AND GO:  Was the test performed?  No    Cognitive Function:        07/15/2023   11:53 AM  6CIT Screen  What Year? 0 points  What month? 0 points  What time? 0 points  Count back from 20 0 points  Months in reverse 0 points  Repeat phrase 2 points  Total Score 2 points    Immunizations Immunization History  Administered Date(s) Administered   Fluad Quad(high Dose 65+) 03/24/2019, 03/31/2020   H1N1 05/16/2008   Influenza,inj,Quad PF,6+ Mos 04/03/2018   Influenza-Unspecified 04/28/2012, 04/28/2013, 04/28/2014, 08/02/2014, 05/08/2015, 08/21/2015, 04/28/2016, 05/27/2017, 03/31/2020, 03/29/2021, 04/19/2022   Moderna Covid-19 Vaccine Bivalent Booster 91yrs & up 04/19/2021   Moderna Sars-Covid-2 Vaccination 08/10/2019, 09/07/2019, 04/28/2020, 05/23/2020, 07/29/2020,  08/29/2020, 11/07/2020   Pfizer(Comirnaty)Fall Seasonal Vaccine 12 years and older 10/11/2022   Pneumococcal Conjugate-13 03/17/2014, 08/02/2014, 08/21/2015   Pneumococcal Polysaccharide-23 08/26/2011, 08/21/2015   Pneumococcal-Unspecified 04/28/2010   Rsv, Bivalent, Protein Subunit Rsvpref,pf Verdis Frederickson) 03/20/2022   Tdap 03/29/2012, 04/22/2012, 12/17/2021   Unspecified SARS-COV-2 Vaccination 04/19/2022   Zoster Recombinant(Shingrix) 03/18/2017, 06/24/2017   Zoster, Live 02/26/2017    TDAP status: Up to date  Flu Vaccine status: Declined, Education has been provided regarding the importance of this vaccine but patient still declined. Advised may receive this vaccine at local pharmacy or Health Dept. Aware to provide a copy of the vaccination record if obtained from local pharmacy or Health Dept. Verbalized acceptance and understanding.  Pneumococcal vaccine status: Up to date  Covid-19 vaccine status: Declined, Education has been provided regarding the importance of this vaccine but patient still declined. Advised may receive this vaccine at local pharmacy or Health Dept.or vaccine clinic. Aware to provide a copy of the vaccination record if obtained from local pharmacy or Health Dept. Verbalized acceptance and understanding.  Qualifies for Shingles Vaccine? Yes   Zostavax completed Yes   Shingrix Completed?: Yes  Screening Tests Health Maintenance  Topic Date Due   COVID-19 Vaccine (12 - 2024-25 season) 05/27/2023   Medicare Annual Wellness (AWV)  07/14/2024   DTaP/Tdap/Td (4 - Td or Tdap) 12/18/2031   Pneumonia Vaccine 37+ Years old  Completed   INFLUENZA VACCINE  Completed   Zoster Vaccines- Shingrix  Completed   HPV VACCINES  Aged Out    Health Maintenance  Health Maintenance Due  Topic Date Due   COVID-19 Vaccine (12 - 2024-25 season) 05/27/2023    Colorectal cancer screening: Type of screening: Colonoscopy Last completed in 2010  Lung Cancer Screening: (Low Dose CT  Chest recommended if Age 83-80 years, 20 pack-year currently smoking OR have quit w/in 15years.) does not qualify.   Lung Cancer Screening Referral: N/A  Additional Screening:  Hepatitis C Screening: N/A   Vision Screening: Recommended annual ophthalmology exams for early detection of glaucoma and other disorders of the eye. Is the patient up to date with their annual eye exam?  Yes  Who is the provider or what is the name of the office in which the patient attends annual eye exams? [facility]  If pt is not established with a provider, would they like to be referred to a provider to establish care? No .   Dental Screening: Recommended annual dental exams for proper oral hygiene  Diabetic Foot Exam: N/A  Community Resource Referral / Chronic Care Management: CRR required this visit?  No   CCM required this visit?  No     Plan:     I have personally reviewed and noted the following in the patient's chart:   Medical and social history Use of alcohol, tobacco or illicit drugs  Current medications and supplements including opioid prescriptions. Patient is not currently taking opioid prescriptions. Functional ability and status Nutritional status Physical activity Advanced directives List of other physicians Hospitalizations, surgeries, and ER visits in previous 12 months Vitals Screenings to include cognitive, depression,  and falls Referrals and appointments  In addition, I have reviewed and discussed with patient certain preventive protocols, quality metrics, and best practice recommendations. A written personalized care plan for preventive services as well as general preventive health recommendations were provided to patient.     Park Meo, FNP   07/15/2023   After Visit Summary: (In Person-Printed) AVS printed and given to the patient

## 2023-07-15 NOTE — Assessment & Plan Note (Signed)
Chronic, no new symptoms, followed by oncology, continue to monitor Oncology OV 06/18/2023 for reference only: SUMMARY: - Stage III Follicular Lymphoma diagnosed in 2003 - Completed 8 cycles of R-CHOP 04/2002 - CT chest/neck/abdomen 12/07/21: RP LAD  - CT CAP: 06/05/22 stable RP LAD - CBC stable; LDH normal; No symptoms - CT CAP 12/10/22 stable to prior - plan for CT CAP in 6 months  [facility] months with CT CAP, then RTC yearly with CTs

## 2023-09-24 ENCOUNTER — Other Ambulatory Visit (HOSPITAL_COMMUNITY): Payer: Self-pay

## 2023-09-24 ENCOUNTER — Encounter: Payer: Self-pay | Admitting: Pharmacy Technician

## 2023-09-24 NOTE — Telephone Encounter (Signed)
 error

## 2024-01-16 ENCOUNTER — Other Ambulatory Visit: Payer: Medicare Other

## 2024-01-19 ENCOUNTER — Encounter: Payer: Medicare Other | Admitting: Family Medicine
